# Patient Record
Sex: Male | Born: 1980 | Race: Black or African American | Hispanic: No | Smoking: Current every day smoker
Health system: Southern US, Community
[De-identification: ages and names within clinical notes are randomized; demographics above are authoritative.]

## PROBLEM LIST (undated history)

## (undated) DIAGNOSIS — F1910 Other psychoactive substance abuse, uncomplicated: Secondary | ICD-10-CM

## (undated) DIAGNOSIS — F1010 Alcohol abuse, uncomplicated: Secondary | ICD-10-CM

## (undated) HISTORY — PX: HERNIA REPAIR: SHX51

---

## 2007-12-28 ENCOUNTER — Emergency Department (HOSPITAL_COMMUNITY): Admission: EM | Admit: 2007-12-28 | Discharge: 2007-12-29 | Payer: Self-pay | Admitting: Emergency Medicine

## 2009-04-07 ENCOUNTER — Emergency Department (HOSPITAL_COMMUNITY): Admission: EM | Admit: 2009-04-07 | Discharge: 2009-04-07 | Payer: Self-pay | Admitting: Emergency Medicine

## 2009-11-01 ENCOUNTER — Emergency Department: Payer: Self-pay | Admitting: Emergency Medicine

## 2009-11-16 ENCOUNTER — Ambulatory Visit: Payer: Self-pay | Admitting: Surgery

## 2010-05-15 ENCOUNTER — Emergency Department (HOSPITAL_COMMUNITY)
Admission: EM | Admit: 2010-05-15 | Discharge: 2010-05-15 | Disposition: A | Discharge status: Home / Self Care | Payer: Self-pay | Attending: Emergency Medicine | Admitting: Emergency Medicine

## 2010-05-15 DIAGNOSIS — K029 Dental caries, unspecified: Secondary | ICD-10-CM | POA: Insufficient documentation

## 2010-05-15 DIAGNOSIS — K089 Disorder of teeth and supporting structures, unspecified: Secondary | ICD-10-CM | POA: Insufficient documentation

## 2010-05-18 ENCOUNTER — Emergency Department (HOSPITAL_COMMUNITY): Payer: Self-pay

## 2010-05-18 ENCOUNTER — Emergency Department (HOSPITAL_COMMUNITY)
Admission: EM | Admit: 2010-05-18 | Discharge: 2010-05-18 | Disposition: A | Discharge status: Home / Self Care | Payer: Self-pay | Attending: Emergency Medicine | Admitting: Emergency Medicine

## 2010-05-18 DIAGNOSIS — R221 Localized swelling, mass and lump, neck: Secondary | ICD-10-CM | POA: Insufficient documentation

## 2010-05-18 DIAGNOSIS — K047 Periapical abscess without sinus: Secondary | ICD-10-CM | POA: Insufficient documentation

## 2010-05-18 DIAGNOSIS — F17200 Nicotine dependence, unspecified, uncomplicated: Secondary | ICD-10-CM | POA: Insufficient documentation

## 2010-05-18 DIAGNOSIS — R220 Localized swelling, mass and lump, head: Secondary | ICD-10-CM | POA: Insufficient documentation

## 2010-05-18 LAB — DIFFERENTIAL
Basophils Relative: 0 % (ref 0–1)
Eosinophils Absolute: 0 10*3/uL (ref 0.0–0.7)
Eosinophils Relative: 0 % (ref 0–5)
Lymphocytes Relative: 17 % (ref 12–46)
Monocytes Absolute: 1.5 10*3/uL — ABNORMAL HIGH (ref 0.1–1.0)
Neutrophils Relative %: 69 % (ref 43–77)

## 2010-05-18 LAB — CBC
HCT: 46.9 % (ref 39.0–52.0)
Platelets: 346 10*3/uL (ref 150–400)
RBC: 5.18 MIL/uL (ref 4.22–5.81)
RDW: 12.7 % (ref 11.5–15.5)
WBC: 10.9 10*3/uL — ABNORMAL HIGH (ref 4.0–10.5)

## 2010-05-18 LAB — POCT I-STAT, CHEM 8
BUN: 10 mg/dL (ref 6–23)
Calcium, Ion: 1.07 mmol/L — ABNORMAL LOW (ref 1.12–1.32)
Chloride: 98 mEq/L (ref 96–112)
HCT: 50 % (ref 39.0–52.0)
Potassium: 4 mEq/L (ref 3.5–5.1)

## 2013-09-15 ENCOUNTER — Encounter (HOSPITAL_COMMUNITY): Payer: Self-pay | Admitting: Emergency Medicine

## 2013-09-15 ENCOUNTER — Emergency Department (HOSPITAL_COMMUNITY)
Admission: EM | Admit: 2013-09-15 | Discharge: 2013-09-15 | Disposition: A | Discharge status: Home / Self Care | Payer: Medicaid Other | Attending: Emergency Medicine | Admitting: Emergency Medicine

## 2013-09-15 ENCOUNTER — Emergency Department (HOSPITAL_COMMUNITY): Payer: Medicaid Other

## 2013-09-15 DIAGNOSIS — R509 Fever, unspecified: Secondary | ICD-10-CM | POA: Insufficient documentation

## 2013-09-15 DIAGNOSIS — R109 Unspecified abdominal pain: Secondary | ICD-10-CM | POA: Diagnosis not present

## 2013-09-15 DIAGNOSIS — R0602 Shortness of breath: Secondary | ICD-10-CM | POA: Diagnosis not present

## 2013-09-15 DIAGNOSIS — R079 Chest pain, unspecified: Secondary | ICD-10-CM | POA: Diagnosis not present

## 2013-09-15 DIAGNOSIS — F17200 Nicotine dependence, unspecified, uncomplicated: Secondary | ICD-10-CM | POA: Insufficient documentation

## 2013-09-15 DIAGNOSIS — N289 Disorder of kidney and ureter, unspecified: Secondary | ICD-10-CM | POA: Diagnosis not present

## 2013-09-15 DIAGNOSIS — R51 Headache: Secondary | ICD-10-CM | POA: Diagnosis not present

## 2013-09-15 DIAGNOSIS — R5383 Other fatigue: Secondary | ICD-10-CM | POA: Diagnosis present

## 2013-09-15 DIAGNOSIS — R5381 Other malaise: Secondary | ICD-10-CM | POA: Diagnosis present

## 2013-09-15 LAB — CBC WITH DIFFERENTIAL/PLATELET
Basophils Absolute: 0.2 10*3/uL — ABNORMAL HIGH (ref 0.0–0.1)
Basophils Relative: 3 % — ABNORMAL HIGH (ref 0–1)
Eosinophils Absolute: 0.3 10*3/uL (ref 0.0–0.7)
Eosinophils Relative: 5 % (ref 0–5)
HCT: 49.4 % (ref 39.0–52.0)
Hemoglobin: 16.6 g/dL (ref 13.0–17.0)
Lymphocytes Relative: 42 % (ref 12–46)
Lymphs Abs: 2.3 10*3/uL (ref 0.7–4.0)
MCH: 29.9 pg (ref 26.0–34.0)
MCHC: 33.6 g/dL (ref 30.0–36.0)
MCV: 89 fL (ref 78.0–100.0)
Monocytes Absolute: 0.7 10*3/uL (ref 0.1–1.0)
Monocytes Relative: 12 % (ref 3–12)
Neutro Abs: 2.1 10*3/uL (ref 1.7–7.7)
Neutrophils Relative %: 38 % — ABNORMAL LOW (ref 43–77)
Platelets: 249 10*3/uL (ref 150–400)
RBC: 5.55 MIL/uL (ref 4.22–5.81)
RDW: 13.4 % (ref 11.5–15.5)
WBC: 5.6 10*3/uL (ref 4.0–10.5)

## 2013-09-15 LAB — BASIC METABOLIC PANEL
Anion gap: 14 (ref 5–15)
BUN: 7 mg/dL (ref 6–23)
CO2: 24 mEq/L (ref 19–32)
Calcium: 9.2 mg/dL (ref 8.4–10.5)
Chloride: 96 mEq/L (ref 96–112)
Creatinine, Ser: 1.45 mg/dL — ABNORMAL HIGH (ref 0.50–1.35)
GFR calc Af Amer: 72 mL/min — ABNORMAL LOW (ref >90)
GFR calc non Af Amer: 62 mL/min — ABNORMAL LOW (ref >90)
Glucose, Bld: 89 mg/dL (ref 70–99)
Potassium: 4.5 mEq/L (ref 3.7–5.3)
Sodium: 134 mEq/L — ABNORMAL LOW (ref 137–147)

## 2013-09-15 LAB — URINALYSIS, ROUTINE W REFLEX MICROSCOPIC
Bilirubin Urine: NEGATIVE
Glucose, UA: NEGATIVE mg/dL
Hgb urine dipstick: NEGATIVE
Ketones, ur: NEGATIVE mg/dL
Leukocytes, UA: NEGATIVE
Nitrite: NEGATIVE
Protein, ur: 30 mg/dL — AB
Specific Gravity, Urine: 1.028 (ref 1.005–1.030)
Urobilinogen, UA: 1 mg/dL (ref 0.0–1.0)
pH: 6.5 (ref 5.0–8.0)

## 2013-09-15 LAB — URINE MICROSCOPIC-ADD ON

## 2013-09-15 LAB — I-STAT CG4 LACTIC ACID, ED: Lactic Acid, Venous: 1.1 mmol/L (ref 0.5–2.2)

## 2013-09-15 MED ORDER — IBUPROFEN 200 MG PO TABS
600.0000 mg | ORAL_TABLET | Freq: Once | ORAL | Status: AC
Start: 2013-09-15 — End: 2013-09-15
  Administered 2013-09-15: 600 mg via ORAL
  Filled 2013-09-15: qty 3

## 2013-09-15 MED ORDER — SODIUM CHLORIDE 0.9 % IV BOLUS (SEPSIS)
1000.0000 mL | Freq: Once | INTRAVENOUS | Status: AC
  Administered 2013-09-15: 1000 mL via INTRAVENOUS

## 2013-09-15 NOTE — ED Notes (Signed)
Pt in radiology 

## 2013-09-15 NOTE — ED Notes (Addendum)
Pt c/o intermittent central chest/upper abdominal cramping, SOB, fever, and upper back pain x 1 week.  Pain score 6/10.  Sts pain and SOB increases w/ "carrying things and walking up steps."  Pt reports not feeling well since he went out for a friends birthday x 1 week ago.  Sts symptoms resolve w/ aspirin.

## 2013-09-15 NOTE — Discharge Instructions (Signed)
Fever, Adult A fever is a higher than normal body temperature. In an adult, an oral temperature around 98.6 F (37 C) is considered normal. A temperature of 100.4 F (38 C) or higher is generally considered a fever. Mild or moderate fevers generally have no long-term effects and often do not require treatment. Extreme fever (greater than or equal to 106 F or 41.1 C) can cause seizures. The sweating that may occur with repeated or prolonged fever may cause dehydration. Elderly people can develop confusion during a fever. A measured temperature can vary with:  Age.  Time of day.  Method of measurement (mouth, underarm, rectal, or ear). The fever is confirmed by taking a temperature with a thermometer. Temperatures can be taken different ways. Some methods are accurate and some are not.  An oral temperature is used most commonly. Electronic thermometers are fast and accurate.  An ear temperature will only be accurate if the thermometer is positioned as recommended by the manufacturer.  A rectal temperature is accurate and done for those adults who have a condition where an oral temperature cannot be taken.  An underarm (axillary) temperature is not accurate and not recommended. Fever is a symptom, not a disease.  CAUSES   Infections commonly cause fever.  Some noninfectious causes for fever include:  Some arthritis conditions.  Some thyroid or adrenal gland conditions.  Some immune system conditions.  Some types of cancer.  A medicine reaction.  High doses of certain street drugs such as methamphetamine.  Dehydration.  Exposure to high outside or room temperatures.  Occasionally, the source of a fever cannot be determined. This is sometimes called a "fever of unknown origin" (FUO).  Some situations may lead to a temporary rise in body temperature that may go away on its own. Examples are:  Childbirth.  Surgery.  Intense exercise. HOME CARE INSTRUCTIONS   Take  appropriate medicines for fever. Follow dosing instructions carefully. If you use acetaminophen to reduce the fever, be careful to avoid taking other medicines that also contain acetaminophen. Do not take aspirin for a fever if you are younger than age 65. There is an association with Reye's syndrome. Reye's syndrome is a rare but potentially deadly disease.  If an infection is present and antibiotics have been prescribed, take them as directed. Finish them even if you start to feel better.  Rest as needed.  Maintain an adequate fluid intake. To prevent dehydration during an illness with prolonged or recurrent fever, you may need to drink extra fluid.Drink enough fluids to keep your urine clear or pale yellow.  Sponging or bathing with room temperature water may help reduce body temperature. Do not use ice water or alcohol sponge baths.  Dress comfortably, but do not over-bundle. SEEK MEDICAL CARE IF:   You are unable to keep fluids down.  You develop vomiting or diarrhea.  You are not feeling at least partly better after 3 days.  You develop new symptoms or problems. SEEK IMMEDIATE MEDICAL CARE IF:   You have shortness of breath or trouble breathing.  You develop excessive weakness.  You are dizzy or you faint.  You are extremely thirsty or you are making little or no urine.  You develop new pain that was not there before (such as in the head, neck, chest, back, or abdomen).  You have persistant vomiting and diarrhea for more than 1 to 2 days.  You develop a stiff neck or your eyes become sensitive to light.  You develop a  skin rash.  You have a fever or persistent symptoms for more than 2 to 3 days.  You have a fever and your symptoms suddenly get worse. MAKE SURE YOU:   Understand these instructions.  Will watch your condition.  Will get help right away if you are not doing well or get worse. Document Released: 08/13/2000 Document Revised: 05/12/2011 Document  Reviewed: 12/19/2010 [facility] Patient Information 2015 [place], [place]. This information is not intended to replace advice given to you by your health care provider. Make sure you discuss any questions you have with your health care provider.   Emergency Department Resource Guide 1) Find a Doctor and Pay Out of Pocket Although you won't have to find out who is covered by your insurance plan, it is a good idea to ask around and get recommendations. You will then need to call the office and see if the doctor you have chosen will accept you as a new patient and what types of options they offer for patients who are self-pay. Some doctors offer discounts or will set up payment plans for their patients who do not have insurance, but you will need to ask so you aren't surprised when you get to your appointment.  2) Contact Your Local Health Department Not all health departments have doctors that can see patients for sick visits, but many do, so it is worth a call to see if yours does. If you don't know where your local health department is, you can check in your phone book. The CDC also has a tool to help you locate your state's health department, and many state websites also have listings of all of their local health departments.  3) Find a Walk-in Clinic If your illness is not likely to be very severe or complicated, you may want to try a walk in clinic. These are popping up all over the country in pharmacies, drugstores, and shopping centers. They're usually staffed by nurse practitioners or physician assistants that have been trained to treat common illnesses and complaints. They're usually fairly quick and inexpensive. However, if you have serious medical issues or chronic medical problems, these are probably not your best option.  No Primary Care Doctor: - Call Health Connect at  [phone] - they can help you locate a primary care doctor that  accepts your insurance, provides certain services,  etc. - Physician Referral Service- [phone]  Chronic Pain Problems: Organization         Address  Phone   Notes  [facility]  [phone] Patients need to be referred by their primary care doctor.   Medication Assistance: Organization         Address  Phone   Notes  [facility] Medication [facility] [address] [place] [place], [place] [address] [phone] --Must be a resident of [facility] -- Must have NO insurance coverage whatsoever (no Medicaid/ Medicare, etc.) -- The pt. MUST have a primary care doctor that directs their care regularly and follows them in the community   [facility]ist  [phone]   [employer]  [phone]    Agencies that provide inexpensive medical care: Organization         Address  Phone   Notes  [facility]  [phone]   [facility]    [phone]   [facility] [address] [place], [place] [address] [phone]   [facility]  [address], [place] [phone]) [phone]   Planned Parenthood    [phone]   [facility]    [phone]   [facility]  [address], [place] Phone:  [phone], Fax:  [phone] Hours of Operation:  9 am - 6 pm, M-F.  Also accepts Medicaid/Medicare and self-pay.  [facility]  [address], [place] Phone: [phone], Fax: [phone]) [phone]. Hours of Operation:  8:30 am - 5:30 pm, M-F.  Also accepts Medicaid and self-pay.  [facility] [place] [address], [place] Phone: [phone]) [phone]   [facility] [address], [place], [place] [phone], Ext. [phone] Mondays & Thursdays: 7-9 AM.  First 15 patients are seen on a first come, first serve basis.    Medicaid-accepting [facility] Providers:  Organization         Address  Phone   Notes  [facility] [address], [place] [phone] Also accepts self-pay patients.  [facility] [address], [place]  [phone]   [facility] [address], [place] [phone]   [facility] [address] [place] [address], [place] [phone]) [phone]   Renaye RakersVeita Bland [address], [place]   [phone] Only accepts [place] Access [place] patients after they have their name applied to their card.   Self-Pay (no insurance) in [facility] [place]:  Organization         Address  Phone   Notes  Sickle Cell Patients, [facility] [address], [place] [phone]   [facility] [address], [place] [phone]) [phone]   [facility] [place]  [address], [place] [phone]   [facility]/Dr. Osei-Bonsu  [address] [place] [address], [place] or [phone], [place] [phone] Phone number for both [place] and [place] locations is the same.  [facility] [address], [place] [phone]   [facility] [address] [place] [address], [place] or [address] [phone]   [facility] [address] [place] [address], [place] [phone]) [phone], phone; [phone], fax Sees patients 1st and 3rd Saturday of every month.  Must not qualify for public or private insurance (i.e. Medicaid, Medicare, [place] Health Choice, Veterans' Benefits)  Household income should be no more than 200% of the poverty level The clinic cannot treat you if you are pregnant or think you are pregnant  Sexually transmitted diseases are not treated at the clinic.    Dental Care: Organization         Address  Phone  Notes  [facility] [address], [place] [phone] Accepts children up to  age 33 who are enrolled in [place] or [place] Health Choice; pregnant women with a Medicaid card; and children who have applied for Medicaid or [place] Health Choice, but were declined, whose parents can pay a reduced fee at time of service.  [facility] of [facility] [place]  [address], [place] [phone] Accepts children up to age 33 who are enrolled in [place] or  Health Choice; pregnant women with a Medicaid card; and children who have applied for  Medicaid or [place] Health Choice, but were declined, whose parents can pay a reduced fee at time of service.  [facility]  [address] [place], [place] [phone] Patients are seen by appointment only. Walk-ins are not accepted. [facility] will see patients 8 years of age and older. Monday - Tuesday (8am-5pm) Most Wednesdays (8:30-5pm) $30 per visit, cash only  [facility]  [address], [facility] [phone] Patients are seen by appointment only. Walk-ins are not accepted. [facility] will see patients 69 years of age and older. One Wednesday Evening (Monthly: Volunteer Based).  $30 per visit, cash only  [employer]  [phone] for adults; Children under age 66, call Graduate Pediatric Dentistry at [phone]. Children aged 36-14, please call [phone] to request a pediatric application.  Dental services are provided in all areas of dental care including fillings, crowns and bridges, complete and partial dentures, implants, gum treatment, root canals, and extractions. Preventive care is also provided. Treatment is provided to both adults and children. Patients are selected via a lottery and there is often a waiting list.   [facility] [address] [place]  [phone] [email or web address]   [facility] [address] [place], [place] [phone], Ext. [phone] Second and Fourth Thursday of  each month, opens at 6:30 AM; Clinic ends at 9 AM.  Patients are seen on a first-come first-served basis, and a limited number are seen during each clinic.   [facility]  [address] [place], [place] [phone]   Eligibility Requirements You must have lived in [place], [place], or [place] counties for at least the last three months.   You cannot be eligible for state or federal sponsored [place], including [employer], [place], or [employer].   You generally cannot be eligible for healthcare insurance through your employer.    How to apply: Eligibility screenings are held every Tuesday and Wednesday afternoon from 1:00 pm until 4:00 pm. You do not need an appointment for the interview!  [facility] [address], [place], [place] [phone]   [facility] Health Department  [phone]   [facility] Health Department  [phone]   [facility] Health Department  [phone]    Behavioral Health Resources in the Community: Intensive Outpatient Programs Organization         Address  Phone  Notes  [facility] [address], [place], [place] [phone]   [facility] [address] [place], [place] [phone]   [facility] [address] [place], [place]  [phone]   [facility] [address],  [place], [place] [phone] or [phone]   Substance Abuse Resources Organization         Address  Phone  Notes  [facility]  [phone]   [facility]  [phone]   [facility]  [phone]   Floydene Flock  [phone]   Residential & Outpatient Substance Abuse Program  [phone]   Psychological Services Organization         Address  Phone  Notes  [facility]  [phone]   [facility] Services  [phone]   [facility] [address],  [place] [phone] or [phone]    Mobile Crisis Teams Organization         Address  Phone  Notes  [facility], Mobile Crisis Care Unit  [phone]   [facility]  [address] [place], [place] [phone]   [facility] [address] [place] [phone]    Self-Help/Support Groups Organization         Address  Phone             Notes  [facility] [place] - variety of support groups  [phone]- I7437963 Call for more information  Narcotics Anonymous (NA), [facility] [address], [employer] [place]  2 meetings at this location   Statistician         Address  Phone  Notes  [facility] [address],    [place]  [phone]   [facility]  [address], [place] [address], [place], [place] [phone]   [facility] [address] [place], [place] [phone] Admissions: 8am-3pm M-F  [facility] [address]    [place], [place] [phone]   [facility] [address] [place], [place], [place] [phone]   [facility] [address],  [place], [place] [phone]   [facility] - Intensive Outpatient [address], [place], [place] [phone]   [facility] ([facility] [address] [place]  [place], [place] [phone] or [phone]   [facility] ([facility]) [address] [place], [place] [phone] Accepts Medicaid  [facility] [place] [address]  [place] [phone] Substance Abuse/Addiction Treatment   [facility] Organization         Address  Phone  Notes  [facility]  [phone]   Angie Fava, PhD [address] [place], [place]   [phone] or [phone]   [facility]al   [address] [place], [place] [phone]     [facility] [address] [place], [place] [phone] Insurance/Medicaid/sponsorship through [facility] and Families [address]                                    [place], [place] [phone] Therapy/tele-psych/case  [facility] [address], [place] [phone]    Dr. Lolly Mustache  [phone]   [facility]  [employer] [facility] Dept. 1) [address] [place] 2) [address], [place] 3)  [address], [place] [phone]  [phone]   [facility] Child Abuse Hotline 517 209 7584 or [phone] (After Hours)

## 2013-09-15 NOTE — Progress Notes (Signed)
[facility] CL provided pt with a list of primary care resources and a [facility] [place] Card application to help patient establish primary care.  °

## 2013-09-17 ENCOUNTER — Emergency Department: Payer: Self-pay | Admitting: Emergency Medicine

## 2013-09-17 LAB — BASIC METABOLIC PANEL
Anion Gap: 7 (ref 7–16)
BUN: 10 mg/dL (ref 7–18)
CHLORIDE: 107 mmol/L (ref 98–107)
CO2: 24 mmol/L (ref 21–32)
CREATININE: 1.45 mg/dL — AB (ref 0.60–1.30)
Calcium, Total: 7.3 mg/dL — ABNORMAL LOW (ref 8.5–10.1)
EGFR (Non-African Amer.): >60
GLUCOSE: 108 mg/dL — AB (ref 65–99)
Osmolality: 275 (ref 275–301)
POTASSIUM: 3.6 mmol/L (ref 3.5–5.1)
SODIUM: 138 mmol/L (ref 136–145)

## 2013-09-17 LAB — CBC WITH DIFFERENTIAL/PLATELET
Comment - H1-Com3: NORMAL
Eosinophil: 5 %
HCT: 47.3 % (ref 40.0–52.0)
HGB: 15.7 g/dL (ref 13.0–18.0)
Lymphocytes: 27 %
MCH: 30.2 pg (ref 26.0–34.0)
MCHC: 33.1 g/dL (ref 32.0–36.0)
MCV: 91 fL (ref 80–100)
Monocytes: 1 %
Platelet: 263 10*3/uL (ref 150–440)
RBC: 5.18 10*6/uL (ref 4.40–5.90)
RDW: 13.7 % (ref 11.5–14.5)
Segmented Neutrophils: 22 %
Variant Lymphocyte - H1-Rlymph: 45 %
WBC: 7.4 10*3/uL (ref 3.8–10.6)

## 2013-09-17 LAB — COMPREHENSIVE METABOLIC PANEL
ALBUMIN: 3.6 g/dL (ref 3.4–5.0)
ALK PHOS: 98 U/L
AST: 81 U/L — AB (ref 15–37)
Anion Gap: 8 (ref 7–16)
BUN: 11 mg/dL (ref 7–18)
Bilirubin,Total: 0.8 mg/dL (ref 0.2–1.0)
CALCIUM: 8.2 mg/dL — AB (ref 8.5–10.1)
Chloride: 102 mmol/L (ref 98–107)
Co2: 23 mmol/L (ref 21–32)
Creatinine: 1.45 mg/dL — ABNORMAL HIGH (ref 0.60–1.30)
EGFR (Non-African Amer.): >60
GLUCOSE: 90 mg/dL (ref 65–99)
OSMOLALITY: 265 (ref 275–301)
POTASSIUM: 3.7 mmol/L (ref 3.5–5.1)
SGPT (ALT): 106 U/L — ABNORMAL HIGH (ref 12–78)
SODIUM: 133 mmol/L — AB (ref 136–145)
Total Protein: 7 g/dL (ref 6.4–8.2)

## 2013-09-22 LAB — CULTURE, BLOOD (SINGLE)

## 2013-09-22 NOTE — ED Provider Notes (Signed)
CSN: [id number]     Arrival date & time 09/15/13  4098 History   First MD Initiated Contact with Patient 09/15/13 0919     Chief Complaint  Patient presents with  . Weakness  . Chest Pain  . Shortness of Breath     (Consider location/radiation/quality/duration/timing/severity/associated sxs/prior Treatment) HPI  33 year old male with fever, body aches, headache abdominal cramping. Symptom onset about a week ago. He started after going up the night before for a friend's birthday. He initially thought that he might have overdone it. Symptoms have persisted for this long to which is why he presented today. Denies any respiratory complaints. Triage note was reviewed, the patient denies any shortness of breath to me. No cough. Mild diffuse throbbing headache. No neck stiffness. Patient has been taking NSAIDs with improvement. No abdominal pain. No urinary complaints. No joint swelling. No rash. No sick contacts that he is aware of. No significant past medical history.  History reviewed. No pertinent past medical history. Past Surgical History  Procedure Laterality Date  . Hernia repair     History reviewed. No pertinent family history. History  Substance Use Topics  . Smoking status: Current Every Day Smoker -- 0.50 packs/day    Types: Cigarettes  . Smokeless tobacco: Not on file  . Alcohol Use: Yes    Review of Systems  All systems reviewed and negative, other than as noted in HPI.   Allergies  Review of patient's allergies indicates no known allergies.  Home Medications   Prior to Admission medications   Medication Sig Start Date End Date Taking? Authorizing Provider  Aspirin-Caffeine (BC FAST PAIN RELIEF) 845-65 MG PACK Take 1 Package by mouth 3 (three) times daily as needed (pain).   Yes Historical Provider, MD  Pseudoephedrine-APAP-DM (DAYQUIL MULTI-SYMPTOM COLD/FLU PO) Take 1 tablet by mouth daily as needed (cold/flu symptoms).   Yes Historical Provider, MD   BP 114/53   Pulse 62  Temp(Src) 98.8 F (37.1 C) (Oral)  Resp 18  SpO2 100% Physical Exam  Nursing note and vitals reviewed. Constitutional: He appears well-developed and well-nourished. No distress.  HENT:  Head: Normocephalic and atraumatic.  Right Ear: External ear normal.  Left Ear: External ear normal.  Oropharynx is clear  Eyes: Conjunctivae and EOM are normal. Pupils are equal, round, and reactive to light. Right eye exhibits no discharge. Left eye exhibits no discharge.  Neck: Neck supple.  No nuchal rigidity  Cardiovascular: Normal rate, regular rhythm and normal heart sounds.  Exam reveals no gallop and no friction rub.   No murmur heard. Pulmonary/Chest: Effort normal and breath sounds normal. No respiratory distress.  Abdominal: Soft. He exhibits no distension. There is no tenderness.  Musculoskeletal: He exhibits no edema and no tenderness.  Lower extremities symmetric as compared to each other. No calf tenderness. Negative Homan's. No palpable cords.   Neurological: He is alert.  Skin: Skin is warm and dry. No erythema.  Psychiatric: He has a normal mood and affect. His behavior is normal. Thought content normal.    ED Course  Procedures (including critical care time) Labs Review Labs Reviewed  CBC WITH DIFFERENTIAL - Abnormal; Notable for the following:    Neutrophils Relative % 38 (*)    Basophils Relative 3 (*)    Basophils Absolute 0.2 (*)    All other components within normal limits  BASIC METABOLIC PANEL - Abnormal; Notable for the following:    Sodium 134 (*)    Creatinine, Ser 1.45 (*)  GFR calc non Af Amer 62 (*)    GFR calc Af Amer 72 (*)    All other components within normal limits  URINALYSIS, ROUTINE W REFLEX MICROSCOPIC - Abnormal; Notable for the following:    Color, Urine AMBER (*)    APPearance CLOUDY (*)    Protein, ur 30 (*)    All other components within normal limits  URINE MICROSCOPIC-ADD ON  I-STAT CG4 LACTIC ACID, ED    Imaging  Review No results found.  Dg Chest 2 View  09/15/2013   CLINICAL DATA:  Mid chest pain and shortness of breath and fever for several days; history of tobacco use  EXAM: CHEST  2 VIEW  COMPARISON:  None.  FINDINGS: The lungs are mildly hyperinflated and clear. The heart and mediastinal structures are normal. There is no pleural effusion or pneumothorax. The bony thorax is unremarkable.  IMPRESSION: There is mild hyperinflation which may be voluntary or could reflect underlying reactive airway disease. There is no focal pneumonia nor other acute cardiopulmonary abnormality.   Electronically Signed   By: David  SwazilandJordan   On: 09/15/2013 10:25    EKG Interpretation   Date/Time:  Thursday September 15 2013 09:21:56 EDT Ventricular Rate:  81 PR Interval:  147 QRS Duration: 98 QT Interval:  355 QTC Calculation: 412 R Axis:   132 Text Interpretation:  Sinus rhythm Right axis deviation ST elev, probable  normal early repol pattern ED PHYSICIAN INTERPRETATION AVAILABLE IN [facility] Confirmed by TEST, Record ([id number]) on 09/17/2013 12:17:18 PM      MDM   Final diagnoses:  Febrile illness   33 year old male with a febrile illness. Etiology is not completely clear. Associated symptoms are nonspecific. Fatigue, mild headache, body aches, etc. he appears well. Lungs are clear. No respiratory complaints chest x-ray is clear. Urinalysis is unremarkable. Some mild renal insufficiency noted. Possibly some insensible losses secondary to fever. He was given some IV fluids. Patient is up and ambulatory in emergency room. Feels stable for discharge at this time. Antibiotics deferred as this may simply be some type of viral illness. Plan symptomatic treatment with NSAIDs, plenty of rest and staying well hydrated. Patient understands the need to return for evaluation for worsening symptoms, persistent fever or anything else concerning to them.    Raeford RazorStephen Merion Grimaldo, MD 09/22/13 1048

## 2015-08-13 IMAGING — CR DG CHEST 2V
2 series · 2 of 2 positions shown · non-contrast
Comparison: None.

CLINICAL DATA: Mid chest pain and shortness of breath and fever for
several days; history of tobacco use

EXAM:
CHEST  2 VIEW

[w chest pa]
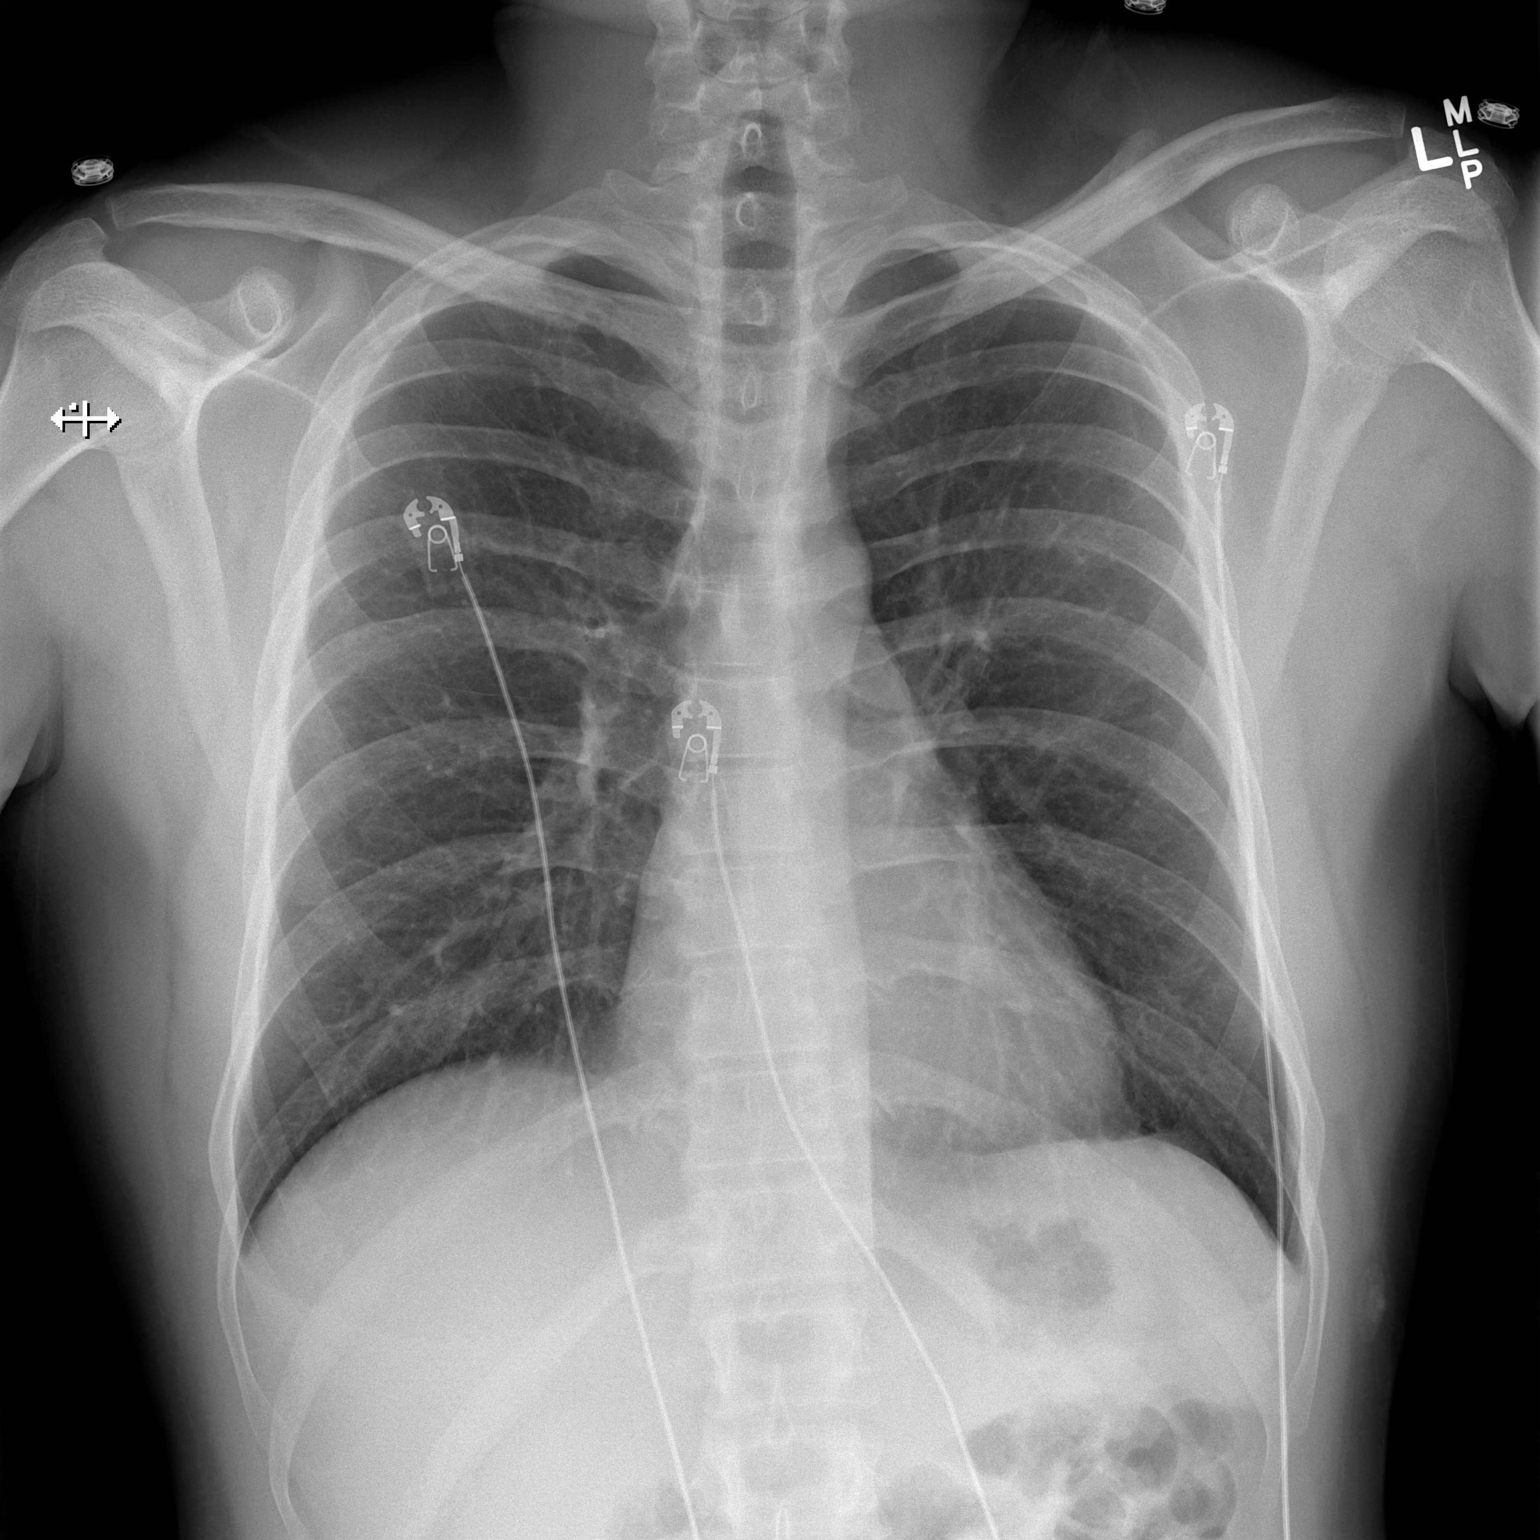

[w chest lat]
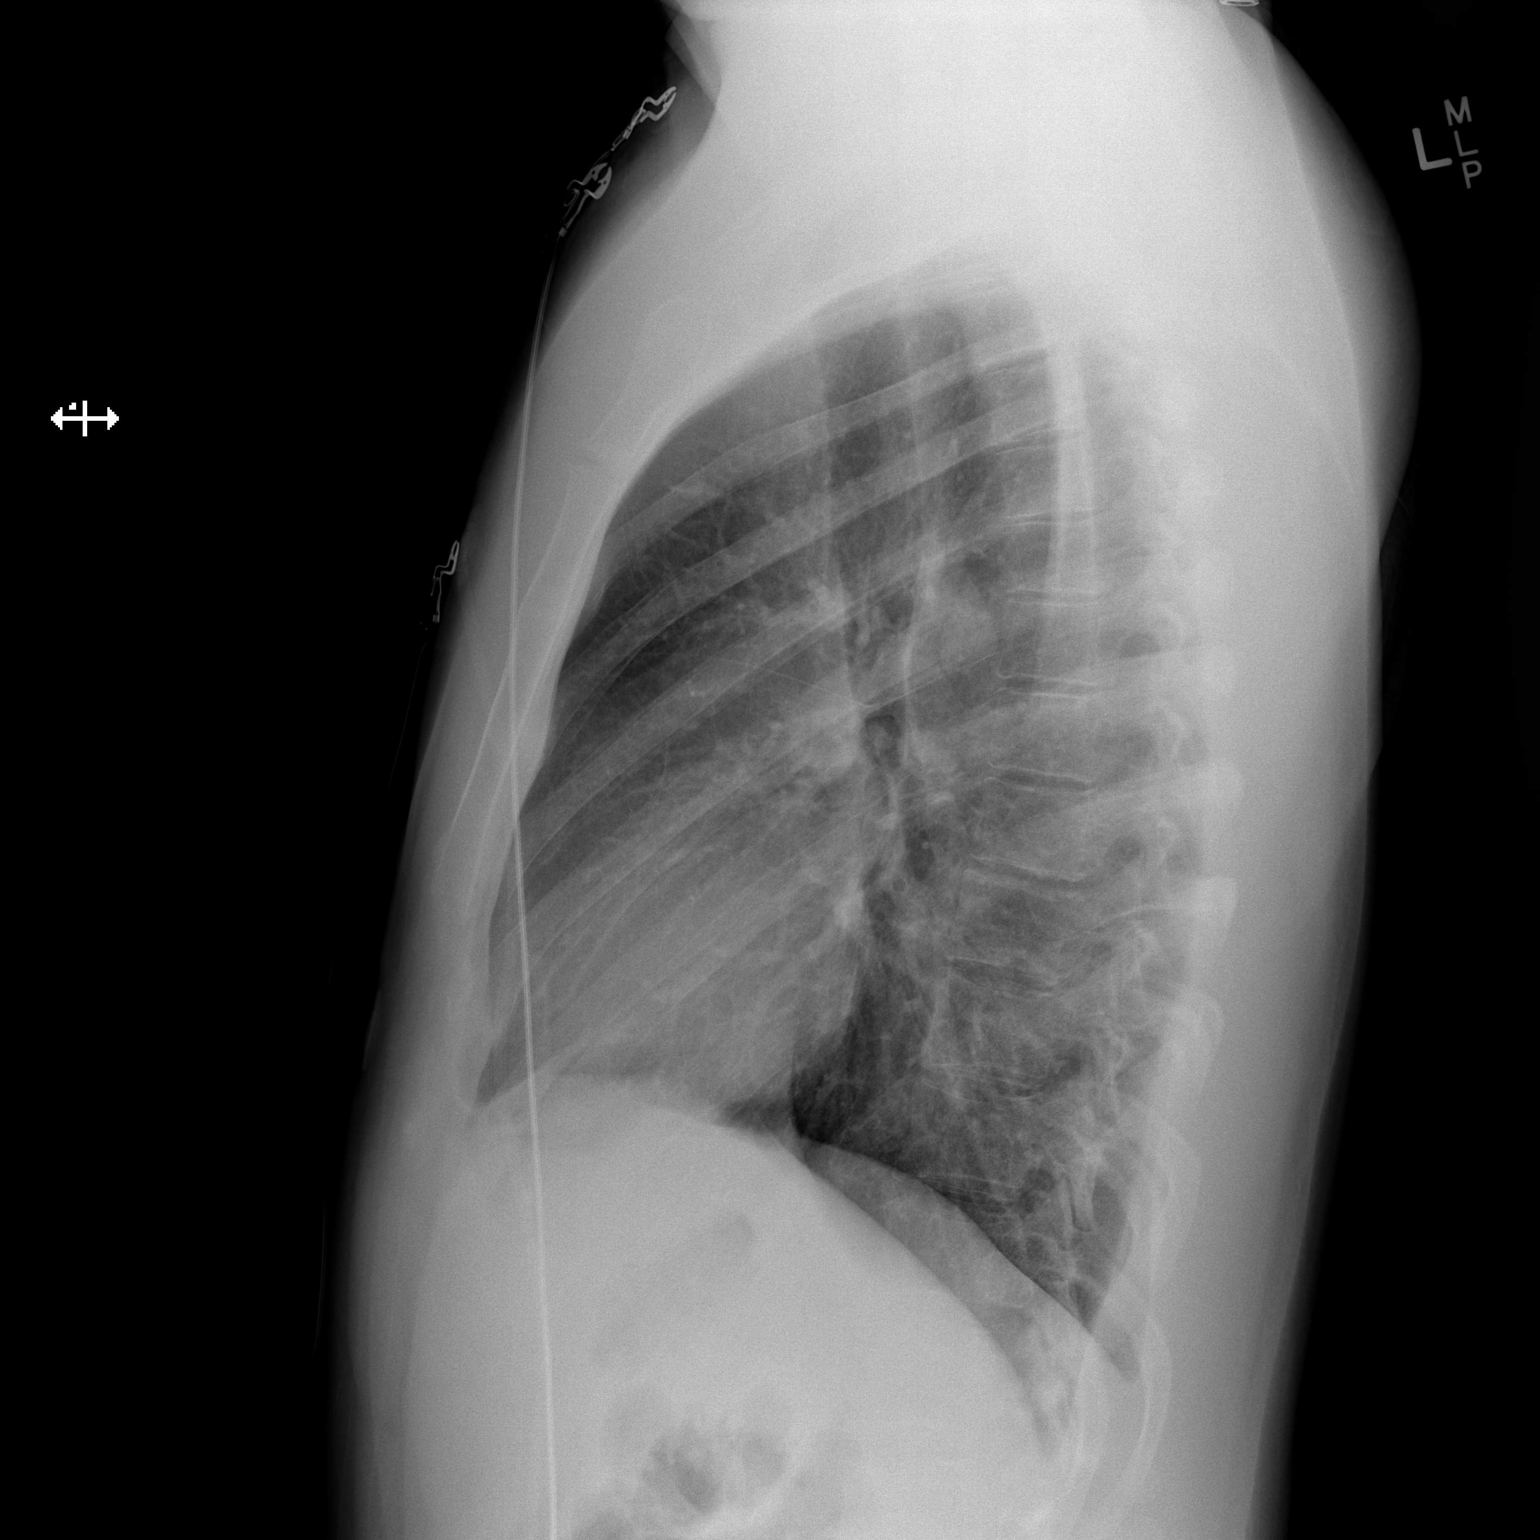

[2 of 2 positions shown; findings below may reference images not displayed]

FINDINGS: The lungs are mildly hyperinflated and clear. The heart and
mediastinal structures are normal. There is no pleural effusion or
pneumothorax. The bony thorax is unremarkable.
IMPRESSION: There is mild hyperinflation which may be voluntary or could reflect
underlying reactive airway disease. There is no focal pneumonia nor
other acute cardiopulmonary abnormality.

## 2015-08-15 IMAGING — CR DG CHEST 2V
1 series · 2 of 2 positions shown · non-contrast
Comparison: None.

CLINICAL DATA: Intermittent cough for 4 days.  Fever.

EXAM:
CHEST  2 VIEW

[Series 1: w chest pa · 0.14mm/px · 2 of 2 slices shown]
[im 1/2]
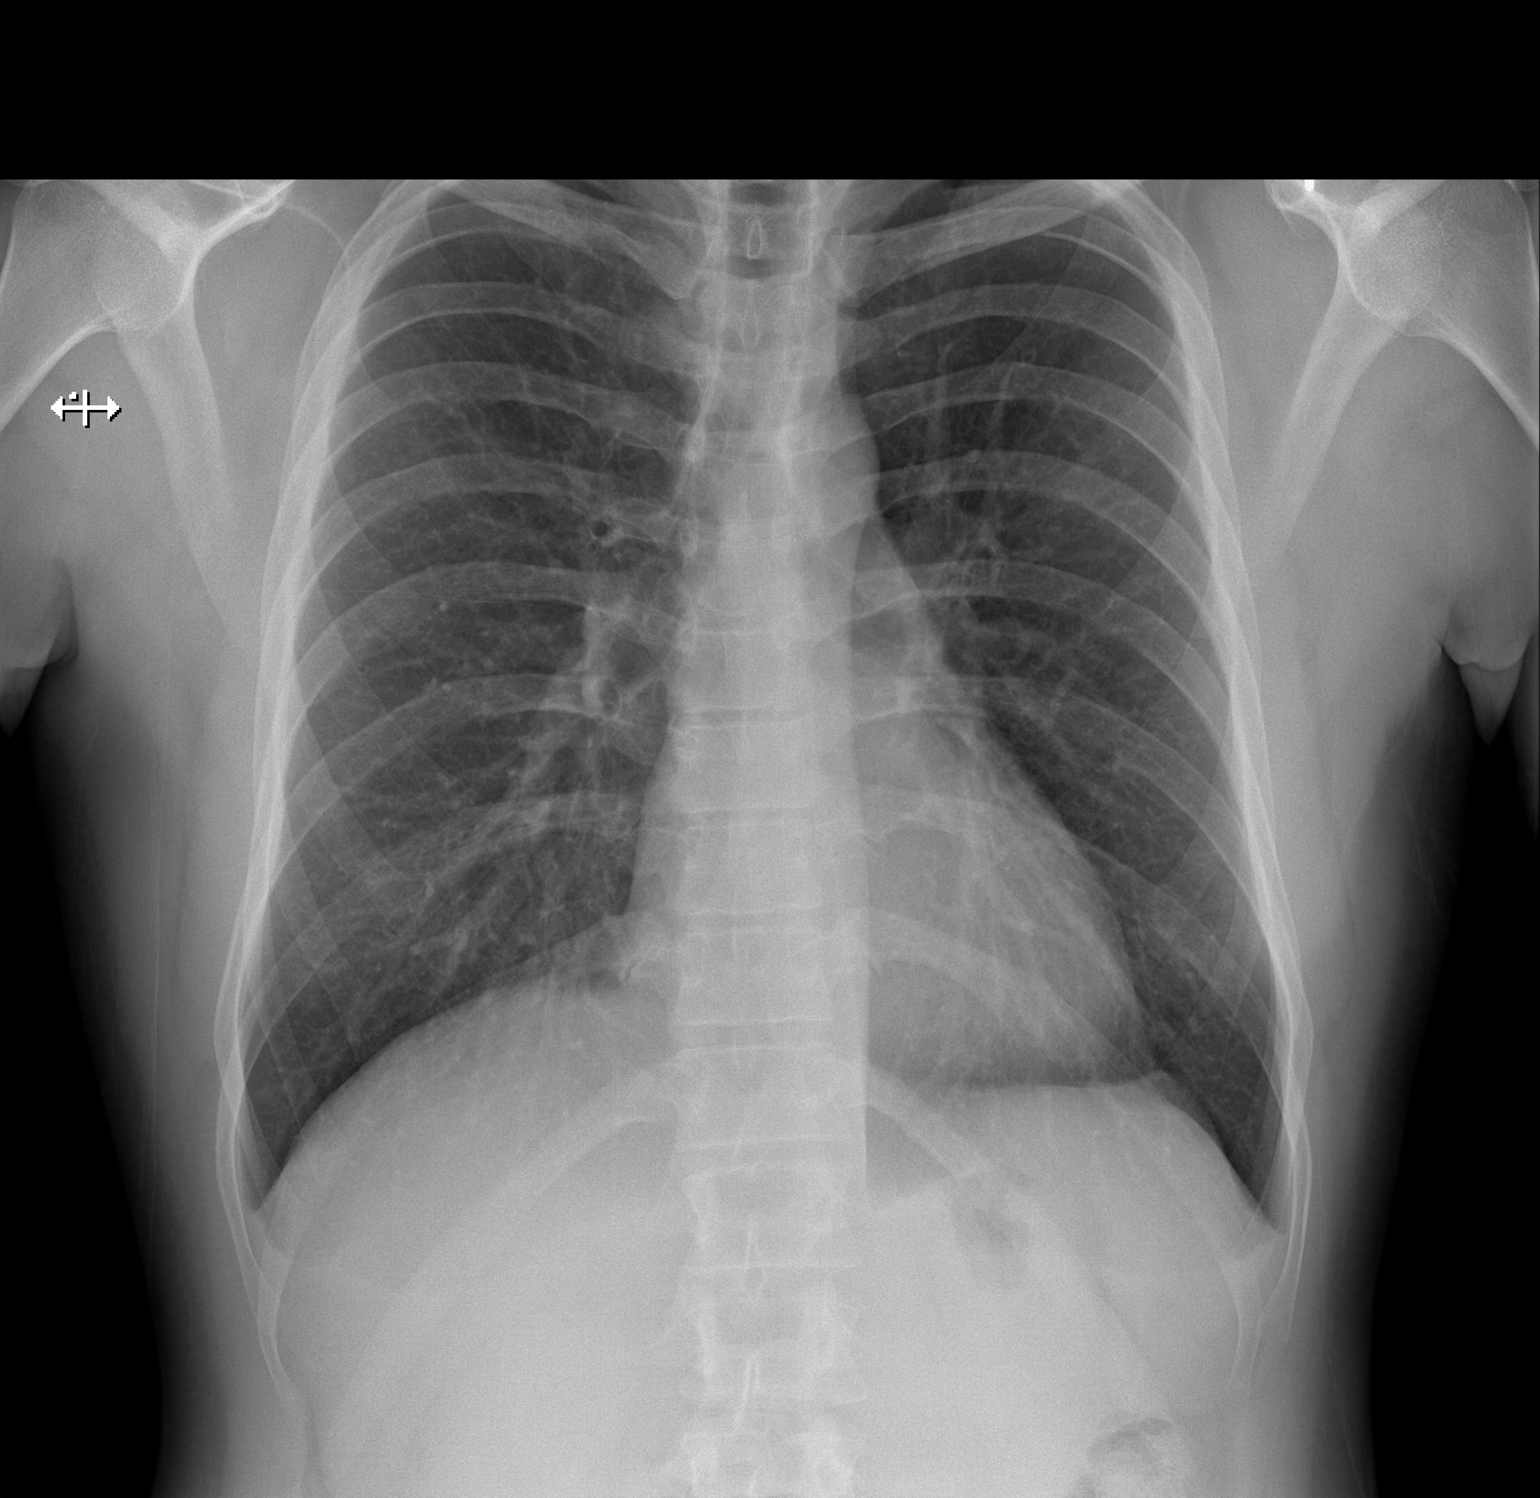
[im 2/2]
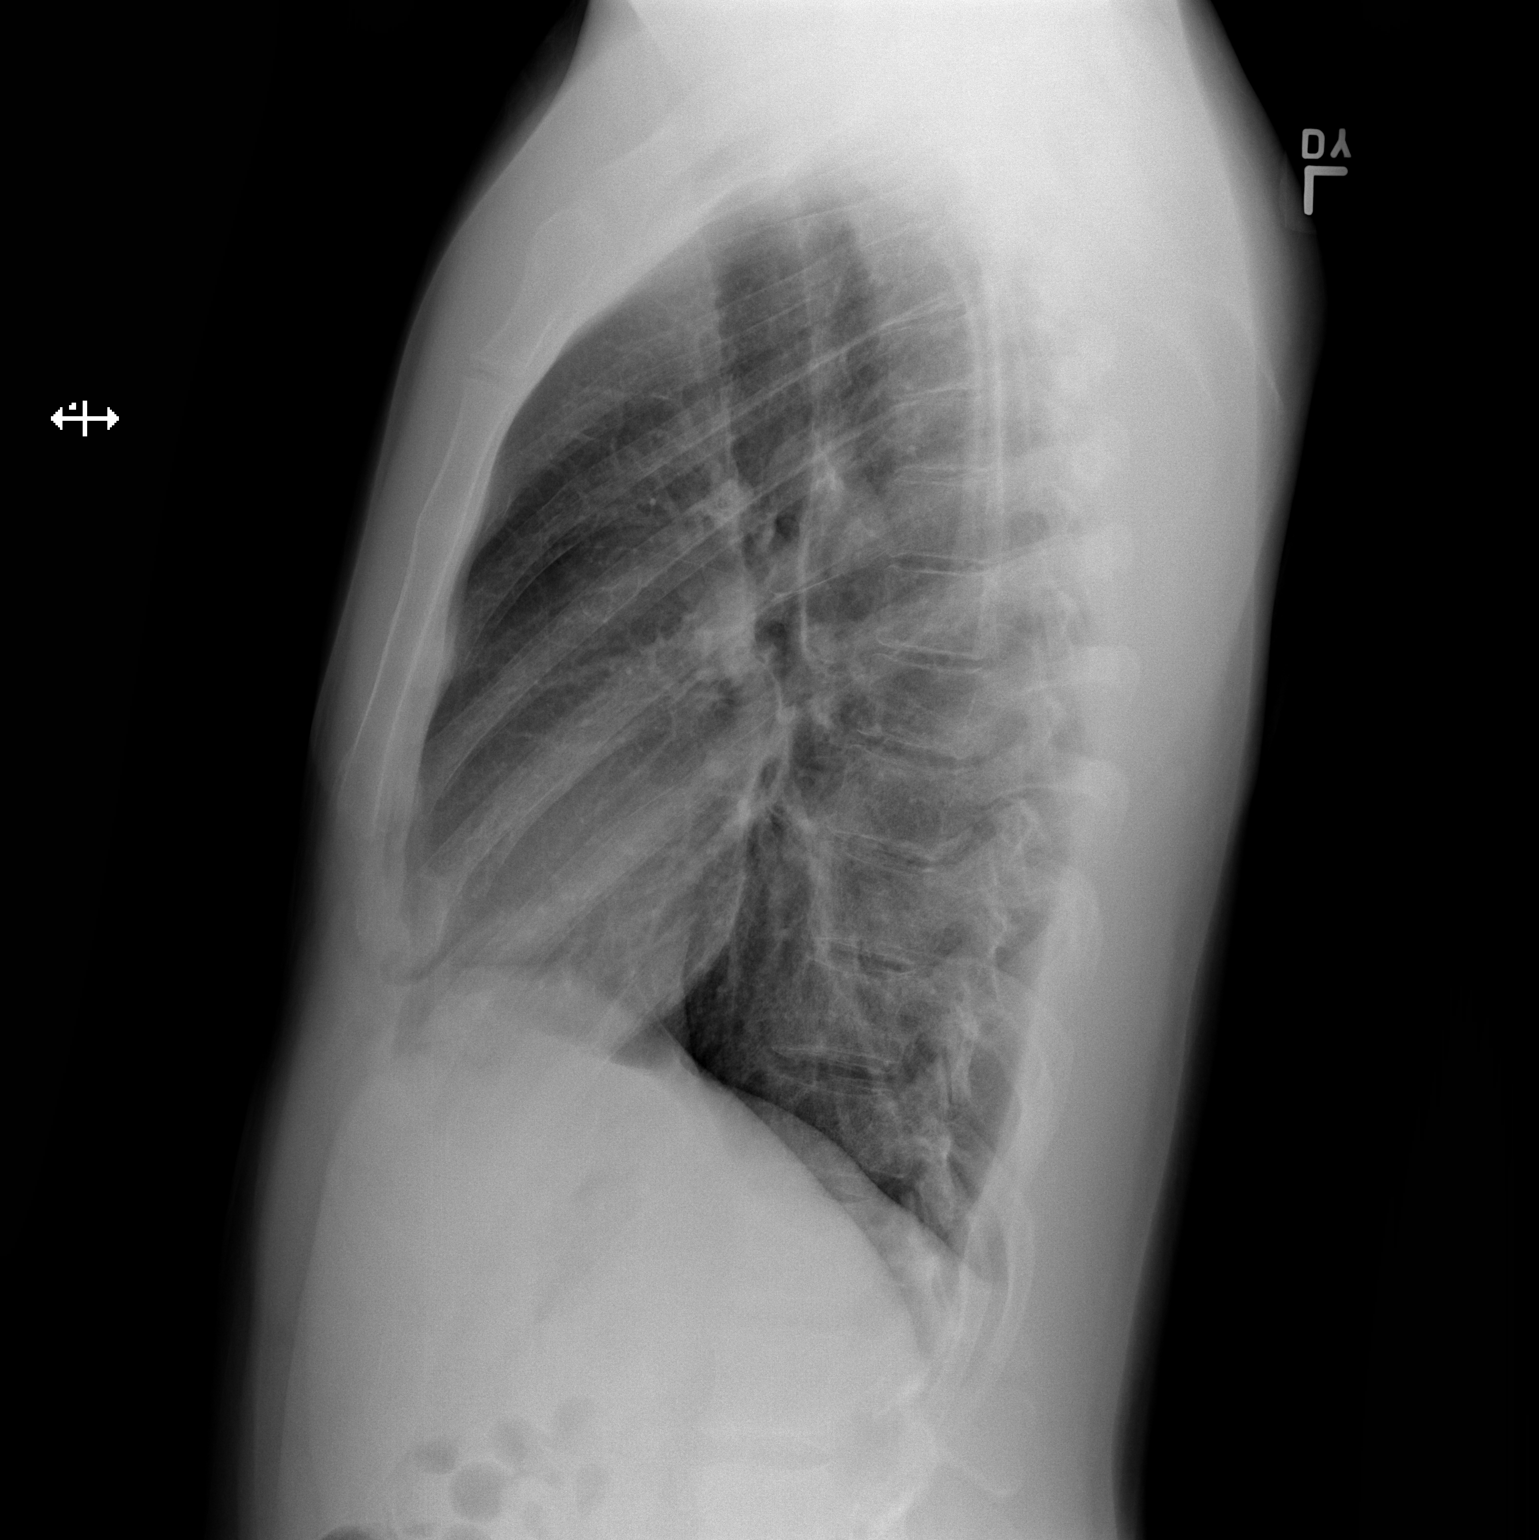

[2 of 2 positions shown; findings below may reference images not displayed]

FINDINGS: Midline trachea. Normal heart size and mediastinal contours. Mild
right hemidiaphragm elevation. No pleural effusion or pneumothorax.
Clear lungs.
IMPRESSION: Normal chest.

## 2015-09-26 ENCOUNTER — Emergency Department
Admission: EM | Admit: 2015-09-26 | Discharge: 2015-09-26 | Disposition: A | Discharge status: Home / Self Care | Payer: Medicaid Other | Attending: Emergency Medicine | Admitting: Emergency Medicine

## 2015-09-26 DIAGNOSIS — Z5321 Procedure and treatment not carried out due to patient leaving prior to being seen by health care provider: Secondary | ICD-10-CM | POA: Insufficient documentation

## 2015-09-26 DIAGNOSIS — H9201 Otalgia, right ear: Secondary | ICD-10-CM | POA: Diagnosis not present

## 2015-09-26 NOTE — ED Triage Notes (Signed)
Patient ambulatory to triage with steady gait, without difficulty or distress noted; pt reports left earache on Sunday with sinus congestion; tonight with right earache

## 2015-11-19 ENCOUNTER — Emergency Department
Admission: EM | Admit: 2015-11-19 | Discharge: 2015-11-19 | Disposition: A | Discharge status: Home / Self Care | Payer: Medicaid Other | Attending: Emergency Medicine | Admitting: Emergency Medicine

## 2015-11-19 ENCOUNTER — Encounter: Payer: Self-pay | Admitting: Emergency Medicine

## 2015-11-19 DIAGNOSIS — E639 Nutritional deficiency, unspecified: Secondary | ICD-10-CM | POA: Diagnosis not present

## 2015-11-19 DIAGNOSIS — K648 Other hemorrhoids: Secondary | ICD-10-CM

## 2015-11-19 DIAGNOSIS — F17210 Nicotine dependence, cigarettes, uncomplicated: Secondary | ICD-10-CM | POA: Diagnosis not present

## 2015-11-19 DIAGNOSIS — K625 Hemorrhage of anus and rectum: Secondary | ICD-10-CM | POA: Diagnosis present

## 2015-11-19 DIAGNOSIS — K644 Residual hemorrhoidal skin tags: Secondary | ICD-10-CM

## 2015-11-19 LAB — COMPREHENSIVE METABOLIC PANEL
ALK PHOS: 69 U/L (ref 38–126)
ALT: 39 U/L (ref 17–63)
AST: 36 U/L (ref 15–41)
Albumin: 4.2 g/dL (ref 3.5–5.0)
Anion gap: 7 (ref 5–15)
BILIRUBIN TOTAL: 0.8 mg/dL (ref 0.3–1.2)
BUN: 7 mg/dL (ref 6–20)
CO2: 27 mmol/L (ref 22–32)
CREATININE: 1.21 mg/dL (ref 0.61–1.24)
Calcium: 9.2 mg/dL (ref 8.9–10.3)
Chloride: 104 mmol/L (ref 101–111)
GFR calc Af Amer: >60 mL/min (ref >60)
GLUCOSE: 91 mg/dL (ref 65–99)
Potassium: 4.4 mmol/L (ref 3.5–5.1)
Sodium: 138 mmol/L (ref 135–145)
TOTAL PROTEIN: 7 g/dL (ref 6.5–8.1)

## 2015-11-19 LAB — CBC
HCT: 48.5 % (ref 40.0–52.0)
Hemoglobin: 16.4 g/dL (ref 13.0–18.0)
MCH: 31.6 pg (ref 26.0–34.0)
MCHC: 33.8 g/dL (ref 32.0–36.0)
MCV: 93.3 fL (ref 80.0–100.0)
PLATELETS: 291 10*3/uL (ref 150–440)
RBC: 5.2 MIL/uL (ref 4.40–5.90)
RDW: 14.7 % — AB (ref 11.5–14.5)
WBC: 8.7 10*3/uL (ref 3.8–10.6)

## 2015-11-19 LAB — URINALYSIS COMPLETE WITH MICROSCOPIC (ARMC ONLY)
BILIRUBIN URINE: NEGATIVE
Bacteria, UA: NONE SEEN
GLUCOSE, UA: NEGATIVE mg/dL
HGB URINE DIPSTICK: NEGATIVE
Ketones, ur: NEGATIVE mg/dL
LEUKOCYTES UA: NEGATIVE
NITRITE: NEGATIVE
Protein, ur: NEGATIVE mg/dL
SPECIFIC GRAVITY, URINE: 1.012 (ref 1.005–1.030)
pH: 7 (ref 5.0–8.0)

## 2015-11-19 LAB — LIPASE, BLOOD: Lipase: 30 U/L (ref 11–51)

## 2015-11-19 MED ORDER — HYDROCORTISONE 2.5 % RE CREA: TOPICAL_CREAM | RECTAL | 0 refills | Status: AC

## 2015-11-19 NOTE — ED Notes (Signed)
Chaperoned rectal exam by Sharma CovertNorman, RN. Pt tolerated well.

## 2015-11-19 NOTE — ED Provider Notes (Signed)
[facility] Emergency Department Provider Note  ____________________________________________  Time seen: Approximately 3:39 PM  I have reviewed the triage vital signs and the nursing notes.   HISTORY  Chief Complaint Abdominal Pain and Rectal Bleeding    HPI Phillip Stephenson is a 35 y.o. male with a low fiber diet presenting with 1 month of intermittent blood streaks with defecation. The patient reports that he has a low fiber diet and often strains when he has a bowel movement. He has noted an increasing frequency of hard stool mixed with blood streaks over the past month. Occasionally has some discomfort with defecation. He denies any fever, chills, lightheadedness or shortness of breath.   History reviewed. No pertinent past medical history.  There are no active problems to display for this patient.   Past Surgical History:  Procedure Laterality Date  . HERNIA REPAIR      Current Outpatient Rx  . Order #: [id number] Class: Historical Med  . Order #: [id number] Class: Print  . Order #: [id number] Class: Historical Med    Allergies Review of patient's allergies indicates no known allergies.  History reviewed. No pertinent family history.  Social History Social History  Substance Use Topics  . Smoking status: Current Every Day Smoker    Packs/day: 0.50    Types: Cigarettes  . Smokeless tobacco: Never Used  . Alcohol use Yes    Review of Systems Constitutional: No fever/chills.No lightheadedness or syncope. Cardiovascular: Denies chest pain. Denies palpitations. Respiratory: Denies shortness of breath.  No cough. Gastrointestinal: No abdominal pain.  No nausea, no vomiting.  No diarrhea.  Positive constipation. Genitourinary: Negative for dysuria. Positive for leading with defecation. Musculoskeletal: Negative for back pain. Skin: Negative for rash. Neurological: Negative for headaches. No focal numbness, tingling or weakness.   10-point ROS  otherwise negative.  ____________________________________________   PHYSICAL EXAM:  VITAL SIGNS: ED Triage Vitals  Enc Vitals Group     BP 11/19/15 1324 (!) 134/95     Pulse Rate 11/19/15 1324 62     Resp 11/19/15 1324 18     Temp 11/19/15 1324 97.2 F (36.2 C)     Temp Source 11/19/15 1324 Oral     SpO2 11/19/15 1324 100 %     Weight 11/19/15 1325 180 lb (81.6 kg)     Height 11/19/15 1325 5\' 6"  (1.676 m)     Head Circumference --      Peak Flow --      Pain Score 11/19/15 1325 7     Pain Loc --      Pain Edu? --      Excl. in GC? --     Constitutional: Alert and oriented. Well appearing and in no acute distress. Answers questions appropriately. Eyes: Conjunctivae are normal.  EOMI. No scleral icterus. Head: Atraumatic. Nose: No congestion/rhinnorhea. Mouth/Throat: Mucous membranes are moist.  Neck: No stridor.  Supple.   Cardiovascular: Normal rate, regular rhythm. No murmurs, rubs or gallops.  Respiratory: Normal respiratory effort.  No accessory muscle use or retractions. Lungs CTAB.  No wheezes, rales or ronchi. Gastrointestinal: Soft, nontender and nondistended.  No guarding or rebound.  No peritoneal signs. GU: Small nonthrombosed external hemorrhoid at 6:00. Positive palpable internal hemorrhoids without significant pain or discomfort. Stool is brown and guaiac-negative. Musculoskeletal: No LE edema.  Neurologic:  A&Ox3.  Speech is clear.  Face and smile are symmetric.  EOMI.  Moves all extremities well. Skin:  Skin is warm, dry and intact. No rash noted.  Psychiatric: Mood and affect are normal. Speech and behavior are normal.  Normal judgement  ____________________________________________   LABS (all labs ordered are listed, but only abnormal results are displayed)  Labs Reviewed  CBC - Abnormal; Notable for the following:       Result Value   RDW 14.7 (*)    All other components within normal limits  URINALYSIS COMPLETEWITH MICROSCOPIC ([facility] ONLY) -  Abnormal; Notable for the following:    Color, Urine YELLOW (*)    APPearance CLEAR (*)    Squamous Epithelial / LPF 0-5 (*)    All other components within normal limits  LIPASE, BLOOD  COMPREHENSIVE METABOLIC PANEL   ____________________________________________  EKG  Not indicated ____________________________________________  RADIOLOGY  No results found.  ____________________________________________   PROCEDURES  Procedure(s) performed: None  Procedures  Critical Care performed: No ____________________________________________   INITIAL IMPRESSION / ASSESSMENT AND PLAN / ED COURSE  Pertinent labs & imaging results that were available during my care of the patient were reviewed by me and considered in my medical decision making (see chart for details).  35 y.o. male with a low fiber diet presenting with bright red blood streaks mixed with hard stools for the past month. Overall, the patient is well-appearing. He has stable vital signs, his hemoglobin and hematocrit are normal, and he has no signs or symptoms consistent with anemia. His examination is most consistent with internal and external hemorrhoids, which are likely the cause of his blood streaks. I will start him on a high-fiber diet with a daily stool softener and Anusol, and have him follow-up. He understands that he will need follow-up and that while the likelihood of a malignancy or other cause for his bleeding such as diverticulitis is low, that he will need reevaluation of the primary care physician. Henderson's return precautions as well as follow-up instructions.  ____________________________________________  FINAL CLINICAL IMPRESSION(S) / ED DIAGNOSES  Final diagnoses:  Internal hemorrhoids  External hemorrhoids  Poor diet  Bright red rectal bleeding    Clinical Course      NEW MEDICATIONS STARTED DURING THIS VISIT:  New Prescriptions   HYDROCORTISONE (ANUSOL-HC) 2.5 % RECTAL CREAM    Apply  rectally 2 times daily      Rockne Menghini, MD 11/19/15 1542

## 2015-11-19 NOTE — Discharge Instructions (Signed)
Please follow the instructions for high fiber diet and start a daily over-the-counter stool softener to prevent constipation and straining. This should improve your hemorrhoids. In addition, you may take Anusol.  Return to the emergency department if you develop severe pain, lightheadedness or shortness of breath, fever, or any other symptoms concerning to you.

## 2015-11-19 NOTE — ED Triage Notes (Signed)
Pt to ed with c/o abd pain and bloody stools x 1 month.

## 2017-03-09 ENCOUNTER — Encounter: Payer: Self-pay | Admitting: Emergency Medicine

## 2017-03-09 ENCOUNTER — Emergency Department
Admission: EM | Admit: 2017-03-09 | Discharge: 2017-03-09 | Disposition: A | Discharge status: Home / Self Care | Payer: Self-pay | Attending: Emergency Medicine | Admitting: Emergency Medicine

## 2017-03-09 DIAGNOSIS — K625 Hemorrhage of anus and rectum: Secondary | ICD-10-CM | POA: Insufficient documentation

## 2017-03-09 DIAGNOSIS — F17210 Nicotine dependence, cigarettes, uncomplicated: Secondary | ICD-10-CM | POA: Insufficient documentation

## 2017-03-09 LAB — COMPREHENSIVE METABOLIC PANEL
ALT: 34 U/L (ref 17–63)
ANION GAP: 9 (ref 5–15)
AST: 35 U/L (ref 15–41)
Albumin: 4.2 g/dL (ref 3.5–5.0)
Alkaline Phosphatase: 73 U/L (ref 38–126)
BUN: 14 mg/dL (ref 6–20)
CHLORIDE: 104 mmol/L (ref 101–111)
CO2: 24 mmol/L (ref 22–32)
Calcium: 9.1 mg/dL (ref 8.9–10.3)
Creatinine, Ser: 1.24 mg/dL (ref 0.61–1.24)
Glucose, Bld: 125 mg/dL — ABNORMAL HIGH (ref 65–99)
POTASSIUM: 3.7 mmol/L (ref 3.5–5.1)
Sodium: 137 mmol/L (ref 135–145)
Total Bilirubin: 0.7 mg/dL (ref 0.3–1.2)
Total Protein: 7.2 g/dL (ref 6.5–8.1)

## 2017-03-09 LAB — CBC
HEMATOCRIT: 50 % (ref 40.0–52.0)
Hemoglobin: 16.9 g/dL (ref 13.0–18.0)
MCH: 32 pg (ref 26.0–34.0)
MCHC: 33.8 g/dL (ref 32.0–36.0)
MCV: 94.8 fL (ref 80.0–100.0)
Platelets: 278 10*3/uL (ref 150–440)
RBC: 5.27 MIL/uL (ref 4.40–5.90)
RDW: 14.7 % — ABNORMAL HIGH (ref 11.5–14.5)
WBC: 10.5 10*3/uL (ref 3.8–10.6)

## 2017-03-09 MED ORDER — DOCUSATE SODIUM 100 MG PO CAPS: 100.0000 mg | ORAL_CAPSULE | Freq: Two times a day (BID) | ORAL | 1 refills | Status: AC

## 2017-03-09 NOTE — ED Triage Notes (Signed)
Patient states that he was diagnosed with hemorrhoids about 2 months ago. Patient states that he had a small amount of bleeding with bowl movements since he was told he has hemorrhoids. Patient states that last night and this morning he has had a moderate amount of bright red bleeding with is bowl movement.

## 2017-03-09 NOTE — ED Provider Notes (Signed)
[facility] Emergency Department Provider Note  Time seen: 8:56 AM  I have reviewed the triage vital signs and the nursing notes.   HISTORY  Chief Complaint GI Bleeding    HPI Phillip Stephenson is a 37 y.o. male with no past medical history who presents to the emergency department for rectal bleeding.  According to the patient for the past 2 months he will intermittently have bleeding when he has a bowel movement.  He states it is not every time but occasionally he will note blood either in the toilet or on the toilet paper.  He states last night he had a large bowel movement and there was a considerable amount of blood in the toilet.  States his stool was formed and appeared to be normal in color.  Had another bowel movement this morning and again had blood in the toilet so he came to the emergency department for evaluation.  He states he was having lower abdominal cramping yesterday, but denies any today or currently.  Denies any fever.  Largely negative review of systems.  He does state occasionally he will have a sharp pain in his rectum/anus when he initiates a bowel movement.  History reviewed. No pertinent past medical history.  There are no active problems to display for this patient.   Past Surgical History:  Procedure Laterality Date  . HERNIA REPAIR      Prior to Admission medications   Medication Sig Start Date End Date Taking? Authorizing Provider  Aspirin-Caffeine (BC FAST PAIN RELIEF) 845-65 MG PACK Take 1 Package by mouth 3 (three) times daily as needed (pain).    [provider]  Pseudoephedrine-APAP-DM (DAYQUIL MULTI-SYMPTOM COLD/FLU PO) Take 1 tablet by mouth daily as needed (cold/flu symptoms).    [provider]    No Known Allergies  No family history on file.  Social History Social History   Tobacco Use  . Smoking status: Current Every Day Smoker    Packs/day: 0.50    Types: Cigarettes  . Smokeless tobacco:  Never Used  Substance Use Topics  . Alcohol use: Yes  . Drug use: Yes    Types: Marijuana    Comment: occ    Review of Systems Constitutional: Negative for fever. Eyes: Negative for visual complaints ENT: Negative for congestion Cardiovascular: Negative for chest pain. Respiratory: Negative for shortness of breath. Gastrointestinal: Lower abdominal cramping yesterday, gone today.  Negative for vomiting occasional nausea.  Formed stool. Genitourinary: Negative for dysuria. Musculoskeletal: Negative for back pain. Skin: Negative for rash. Neurological: Negative for headache All other ROS negative  ____________________________________________   PHYSICAL EXAM:  VITAL SIGNS: ED Triage Vitals  Enc Vitals Group     BP 03/09/17 0642 (!) 155/85     Pulse Rate 03/09/17 0642 (!) 54     Resp 03/09/17 0642 18     Temp 03/09/17 0642 97.7 F (36.5 C)     Temp Source 03/09/17 0642 Oral     SpO2 03/09/17 0642 97 %     Weight 03/09/17 0638 170 lb (77.1 kg)     Height 03/09/17 0638 5\' 9"  (1.753 m)     Head Circumference --      Peak Flow --      Pain Score 03/09/17 0631 6     Pain Loc --      Pain Edu? --      Excl. in GC? --    Constitutional: Alert and oriented. Well appearing and in no  distress. Eyes: Normal exam ENT   Head: Normocephalic and atraumatic.   Mouth/Throat: Mucous membranes are moist. Cardiovascular: Normal rate, regular rhythm. No murmur Respiratory: Normal respiratory effort without tachypnea nor retractions. Breath sounds are clear Gastrointestinal: Soft and nontender. No distention.    Musculoskeletal: Nontender with normal range of motion in all extremities. Neurologic:  Normal speech and language. No gross focal neurologic deficits Skin:  Skin is warm, dry and intact.  Psychiatric: Mood and affect are normal.   ____________________________________________   INITIAL IMPRESSION / ASSESSMENT AND PLAN / ED COURSE  Pertinent labs & imaging results  that were available during my care of the patient were reviewed by me and considered in my medical decision making (see chart for details).  Patient presents the emergency department with concerns of rectal bleeding intermittently over the past 2 months but worse over the past 2 days.  Patient states he was seen in the emergency department several months ago for the same and was diagnosed with probable hemorrhoids.  Differential would include external hemorrhoids, internal hemorrhoids, anal fissure, lower GI bleed, cancers process.  Overall the patient appears very well, normal physical exam including nontender abdominal exam.  On rectal examination the patient does have one moderate sized external hemorrhoid, rectal exam he has light brown stool but it is guaiac positive.  Discussed with the patient the need to follow-up with GI medicine he is agreeable to this plan.  Suspect likely hemorrhoidal bleeding.  Patient's labs are reassuring, exam is reassuring.  I believe the patient is safe for discharge home with GI follow-up.  Patient is agreeable to this plan of care.  I discussed a trial of Colace twice daily for the next 2 months while awaiting GI appointment.  ____________________________________________   FINAL CLINICAL IMPRESSION(S) / ED DIAGNOSES  Rectal bleeding    Minna AntisPaduchowski, Preston Weill, MD 03/09/17 [phone]

## 2017-05-28 ENCOUNTER — Emergency Department
Admission: EM | Admit: 2017-05-28 | Discharge: 2017-05-28 | Disposition: A | Discharge status: Home / Self Care | Payer: Self-pay | Attending: Emergency Medicine | Admitting: Emergency Medicine

## 2017-05-28 ENCOUNTER — Other Ambulatory Visit: Payer: Self-pay

## 2017-05-28 ENCOUNTER — Encounter: Payer: Self-pay | Admitting: Emergency Medicine

## 2017-05-28 DIAGNOSIS — K644 Residual hemorrhoidal skin tags: Secondary | ICD-10-CM | POA: Insufficient documentation

## 2017-05-28 DIAGNOSIS — F17210 Nicotine dependence, cigarettes, uncomplicated: Secondary | ICD-10-CM | POA: Insufficient documentation

## 2017-05-28 MED ORDER — HYDROCORTISONE ACETATE 25 MG RE SUPP
25.0000 mg | Freq: Once | RECTAL | Status: AC
  Administered 2017-05-28: 25 mg via RECTAL
  Filled 2017-05-28: qty 1

## 2017-05-28 MED ORDER — HYDROCORTISONE ACETATE 25 MG RE SUPP: 25.0000 mg | Freq: Two times a day (BID) | RECTAL | 0 refills | Status: AC | PRN

## 2017-05-28 NOTE — ED Provider Notes (Addendum)
[facility] Emergency Department Provider Note    First MD Initiated Contact with Patient 05/28/17 [phone]     (approximate)  I have reviewed the triage vital signs and the nursing notes.   HISTORY  Chief Complaint Rectal Bleeding    HPI Phillip Stephenson is a 37 y.o. male presents to the emergency department with intermittent bright red blood per rectum times months patient states that symptoms recurred 2 days ago.  Patient denies any rectal bleeding today.  Patient was seen on 11/19/2015 and 03/09/2017 for the same and diagnosed with external hemorrhoids   Past medical history Inguinal hernia External hemorrhoid There are no active problems to display for this patient.   Past Surgical History:  Procedure Laterality Date  . HERNIA REPAIR      Prior to Admission medications   Medication Sig Start Date End Date Taking? Authorizing Provider  Aspirin-Caffeine (BC FAST PAIN RELIEF) 845-65 MG PACK Take 1 Package by mouth 3 (three) times daily as needed (pain).    [provider]  Pseudoephedrine-APAP-DM (DAYQUIL MULTI-SYMPTOM COLD/FLU PO) Take 1 tablet by mouth daily as needed (cold/flu symptoms).    [provider]    Allergies No known drug allergies   Family history Not contributory  Social History Social History   Tobacco Use  . Smoking status: Current Every Day Smoker    Packs/day: 0.50    Types: Cigarettes  . Smokeless tobacco: Never Used  Substance Use Topics  . Alcohol use: Yes    Comment: socially  . Drug use: Yes    Types: Marijuana    Comment: occ    Review of Systems Constitutional: No fever/chills Eyes: No visual changes. ENT: No sore throat. Cardiovascular: Denies chest pain. Respiratory: Denies shortness of breath. Gastrointestinal: No abdominal pain.  No nausea, no vomiting.  No diarrhea.  No constipation.  Positive for bright red blood per rectum Genitourinary: Negative for dysuria. Musculoskeletal:  Negative for neck pain.  Negative for back pain. Integumentary: Negative for rash. Neurological: Negative for headaches, focal weakness or numbness.   ____________________________________________   PHYSICAL EXAM:  VITAL SIGNS: ED Triage Vitals  Enc Vitals Group     BP 05/28/17 0602 (!) 136/97     Pulse Rate 05/28/17 0602 80     Resp 05/28/17 0602 18     Temp 05/28/17 0602 98 F (36.7 C)     Temp Source 05/28/17 0602 Oral     SpO2 05/28/17 0602 99 %     Weight 05/28/17 0601 86.2 kg (190 lb)     Height 05/28/17 0601 1.727 m (5\' 8" )     Head Circumference --      Peak Flow --      Pain Score 05/28/17 0600 6     Pain Loc --      Pain Edu? --      Excl. in GC? --     Constitutional: Alert and oriented. Well appearing and in no acute distress. Eyes: Conjunctivae are normal.  Head: Atraumatic. Mouth/Throat: Mucous membranes are moist.{**  Oropharynx non-erythematous. Neck: No stridor.   Cardiovascular: Normal rate, regular rhythm. Good peripheral circulation. Grossly normal heart sounds. Respiratory: Normal respiratory effort.  No retractions. Lungs CTAB. Gastrointestinal: Soft and nontender. No distention.  External hemorrhoid noted 6 o'clock position  musculoskeletal: No lower extremity tenderness nor edema. No gross deformities of extremities. Neurologic:  Normal speech and language. No gross focal neurologic deficits are appreciated.  Skin:  Skin is warm, dry  and intact. No rash noted. Psychiatric: Mood and affect are normal. Speech and behavior are normal.      Procedures   ____________________________________________   INITIAL IMPRESSION / ASSESSMENT AND PLAN / ED COURSE  As part of my medical decision making, I reviewed the following data within the electronic MEDICAL RECORD NUMBER  37 year old male presented with above-stated history physical exam consistent with external hemorrhoid patient will be referred to Dr. Excell Seltzer general surgery for outpatient management     ____________________________________________  FINAL CLINICAL IMPRESSION(S) / ED DIAGNOSES  Final diagnoses:  External hemorrhoid     MEDICATIONS GIVEN DURING THIS VISIT:  Medications - No data to display   ED Discharge Orders    None       Note:  This document was prepared using Dragon voice recognition software and may include unintentional dictation errors.    Darci Current, MD 05/28/17 1610    Darci Current, MD 05/28/17 [phone]

## 2017-05-28 NOTE — ED Notes (Signed)
ED Provider at bedside. 

## 2017-05-28 NOTE — ED Notes (Signed)

## 2017-05-28 NOTE — ED Notes (Signed)
Present for rectal exam with MD Manson PasseyBrown, external hemorrhoid present

## 2017-05-28 NOTE — ED Triage Notes (Signed)
Patient ambulatory to triage with steady gait, without difficulty or distress noted; pt reports having rectal bleeding x 2 days with lower abd pain; hx of same

## 2017-12-17 ENCOUNTER — Encounter: Payer: Self-pay | Admitting: Emergency Medicine

## 2017-12-17 ENCOUNTER — Emergency Department
Admission: EM | Admit: 2017-12-17 | Discharge: 2017-12-17 | Disposition: A | Discharge status: Home / Self Care | Payer: Self-pay | Attending: Emergency Medicine | Admitting: Emergency Medicine

## 2017-12-17 ENCOUNTER — Other Ambulatory Visit: Payer: Self-pay

## 2017-12-17 DIAGNOSIS — K0889 Other specified disorders of teeth and supporting structures: Secondary | ICD-10-CM | POA: Insufficient documentation

## 2017-12-17 DIAGNOSIS — F17210 Nicotine dependence, cigarettes, uncomplicated: Secondary | ICD-10-CM | POA: Insufficient documentation

## 2017-12-17 MED ORDER — IBUPROFEN 600 MG PO TABS: 600.0000 mg | ORAL_TABLET | Freq: Three times a day (TID) | ORAL | 0 refills | Status: DC | PRN

## 2017-12-17 MED ORDER — AMOXICILLIN 500 MG PO CAPS: 500.0000 mg | ORAL_CAPSULE | Freq: Three times a day (TID) | ORAL | 0 refills | Status: DC

## 2017-12-17 NOTE — Discharge Instructions (Addendum)
Begin taking antibiotics as directed until completely finished.  Take ibuprofen every 8 hours with food. Follow-up with 1 the clinics listed on your discharge papers.  The [facility] clinic listed on your papers also has walk-in hours.  OPTIONS FOR DENTAL FOLLOW UP CARE  [place] Department of Health and Human Services - Local Safety Net Dental Clinics [email or web address]   [facility] [phone])  Sharl Ma [phone])  [place] [phone])  [facility] [phone])  [facility] [phone]) This clinic caters to the indigent population and is on a lottery system. Location: [employer] [facility], [employer], [address], [place] Clinic Hours: Wednesdays from 6pm - 9pm, patients seen by a lottery system. For dates, call or go to [email or web address] Services: Cleanings, fillings and simple extractions. Payment Options: DENTAL WORK IS FREE OF CHARGE. Bring proof of income or support. Best way to get seen: Arrive at 5:15 pm - this is a lottery, NOT first come/first serve, so arriving earlier will not increase your chances of being seen.     [facility] [phone] Select option 1 for emergencies   Location: [facility]istry, [place], [address] [place] Clinic Hours: No walk-ins accepted - call the day before to schedule an appointment. Check in times are 9:30 am and 1:30 pm. Services: Simple extractions, temporary fillings, pulpectomy/pulp debridement, uncomplicated abscess drainage. Payment Options: PAYMENT IS DUE AT THE TIME OF SERVICE.  Fee is usually $100-200, additional surgical procedures (e.g. abscess drainage) may be extra. Cash, checks, Visa/MasterCard accepted.  Can file Medicaid if patient is covered for dental - patient should call case worker to  check. No discount for [facility] patients. Best way to get seen: MUST call the day before and get onto the schedule. Can usually be seen the next 1-2 days. No walk-ins accepted.     [facility] [phone]   Location: [facility], [address], [place] Clinic Hours: M, W, Th, F 8am or 1:30pm, Tues 9a or 1:30 - first come/first served. Services: Simple extractions, temporary fillings, uncomplicated abscess drainage.  You do not need to be an [facility] resident. Payment Options: PAYMENT IS DUE AT THE TIME OF SERVICE. Dental insurance, otherwise sliding scale - bring proof of income or support. Depending on income and treatment needed, cost is usually $50-200. Best way to get seen: Arrive early as it is first come/first served.     [facility] [phone]   Location: [address] Clinic Hours: Mon-Thu 8a-5p Services: Most basic dental services including extractions and fillings. Payment Options: PAYMENT IS DUE AT THE TIME OF SERVICE. Sliding scale, up to 50% off - bring proof if income or support. Medicaid with dental option accepted. Best way to get seen: Call to schedule an appointment, can usually be seen within 2 weeks OR they will try to see walk-ins - show up at 8a or 2p (you may have to wait).     [facility] [phone] [place] RESIDENTS ONLY   Location: [facility], [address] [place], [place] [address] Clinic Hours: By appointment only. Monday - Thursday 8am-5pm, Friday 8am-12pm Services: Cleanings, fillings, extractions. Payment Options: PAYMENT IS DUE AT THE TIME OF SERVICE. Cash, Visa or MasterCard. Sliding scale - $30 minimum per service. Best way to get seen: Come in to office, complete packet and make an appointment - need  proof of income or support monies for each household member and proof of [place]  residence. Usually takes about a month to get in.     [facility] [phone]   Location: [address] [facility] Hours: Walk-in Urgent Care Dental Services are offered Monday-Friday mornings only. The numbers of emergencies accepted daily is limited to the number of providers available. Maximum 15 - Mondays, Wednesdays & Thursdays Maximum 10 - Tuesdays & Fridays Services: You do not need to be a [place] resident to be seen for a dental emergency. Emergencies are defined as pain, swelling, abnormal bleeding, or dental trauma. Walkins will receive x-rays if needed. NOTE: Dental cleaning is not an emergency. Payment Options: PAYMENT IS DUE AT THE TIME OF SERVICE. Minimum co-pay is $40.00 for uninsured patients. Minimum co-pay is $3.00 for Medicaid with dental coverage. Dental Insurance is accepted and must be presented at time of visit. Medicare does not cover dental. Forms of payment: Cash, credit card, checks. Best way to get seen: If not previously registered with the clinic, walk-in dental registration begins at 7:15 am and is on a first come/first serve basis. If previously registered with the clinic, call to make an appointment.     [facility] [phone] [place] RESIDENTS ONLY   Location: [address], [place], [place] Clinic Hours: Mon-Thu 10a-2p Services: Extractions only! Payment Options: FREE (donations accepted) - bring proof of income or support Best way to get seen: Call and schedule an appointment OR come at 8am on the 1st Monday of every month (except for holidays) when it is first come/first served.     [facility] [phone]   Location: [address], [place] Clinic Hours: Friday mornings Services, Payment Options, Best way to get seen: Call for info

## 2017-12-17 NOTE — ED Provider Notes (Signed)
[facility] Emergency Department Provider Note   ____________________________________________   First MD Initiated Contact with Patient 12/17/17 [phone]     (approximate)  I have reviewed the triage vital signs and the nursing notes.   HISTORY  Chief Complaint Dental Pain    HPI Phillip Stephenson is a 37 y.o. male presents to the ED with complaint of right lower dental pain that began last evening.  He denies any injury to his teeth.  He also denies any fever, chills, nausea or vomiting.  He rates his pain as 6 out of 10.  History reviewed. No pertinent past medical history.  There are no active problems to display for this patient.   Past Surgical History:  Procedure Laterality Date  . HERNIA REPAIR      Prior to Admission medications   Medication Sig Start Date End Date Taking? Authorizing Provider  amoxicillin (AMOXIL) 500 MG capsule Take 1 capsule (500 mg total) by mouth 3 (three) times daily. 12/17/17   Tommi Rumps, PA-C  Aspirin-Caffeine (BC FAST PAIN RELIEF) 845-65 MG PACK Take 1 Package by mouth 3 (three) times daily as needed (pain).    [provider]  ibuprofen (ADVIL,MOTRIN) 600 MG tablet Take 1 tablet (600 mg total) by mouth every 8 (eight) hours as needed. 12/17/17   Tommi Rumps, PA-C  Pseudoephedrine-APAP-DM (DAYQUIL MULTI-SYMPTOM COLD/FLU PO) Take 1 tablet by mouth daily as needed (cold/flu symptoms).    [provider]    Allergies Patient has no known allergies.  No family history on file.  Social History Social History   Tobacco Use  . Smoking status: Current Every Day Smoker    Packs/day: 0.50    Types: Cigarettes  . Smokeless tobacco: Never Used  Substance Use Topics  . Alcohol use: Yes    Comment: socially  . Drug use: Yes    Types: Marijuana    Comment: occ    Review of Systems Constitutional: No fever/chills Eyes: No visual changes. ENT: No sore throat.  Positive for dental  pain. Cardiovascular: Denies chest pain. Respiratory: Denies shortness of breath. Skin: Negative for rash. Neurological: Negative for headaches, focal weakness or numbness. ____________________________________________   PHYSICAL EXAM:  VITAL SIGNS: ED Triage Vitals  Enc Vitals Group     BP 12/17/17 0602 (!) 131/97     Pulse Rate 12/17/17 0602 62     Resp 12/17/17 0602 16     Temp 12/17/17 0602 97.9 F (36.6 C)     Temp Source 12/17/17 0602 Oral     SpO2 12/17/17 0602 98 %     Weight 12/17/17 0559 190 lb (86.2 kg)     Height 12/17/17 0559 5\' 6"  (1.676 m)     Head Circumference --      Peak Flow --      Pain Score 12/17/17 0559 6     Pain Loc --      Pain Edu? --      Excl. in GC? --    Constitutional: Alert and oriented. Well appearing and in no acute distress. Eyes: Conjunctivae are normal.  Head: Atraumatic. Nose: No congestion/rhinnorhea. Mouth/Throat: Mucous membranes are moist.  Oropharynx non-erythematous.  Right lower premolar with a small cary present.  No gum edema.  No drainage.  Area is tender. Neck: No stridor.   Hematological/Lymphatic/Immunilogical: No cervical lymphadenopathy. Cardiovascular: Normal rate, regular rhythm. Grossly normal heart sounds.  Good peripheral circulation. Respiratory: Normal respiratory effort.  No retractions. Lungs CTAB.  Gastrointestinal: Soft and nontender. No distention. No abdominal bruits. No CVA tenderness. Musculoskeletal: No lower extremity tenderness nor edema.  No joint effusions. Neurologic:  Normal speech and language. No gross focal neurologic deficits are appreciated.  Skin:  Skin is warm, dry and intact. No rash noted. Psychiatric: Mood and affect are normal. Speech and behavior are normal.  ____________________________________________   LABS (all labs ordered are listed, but only abnormal results are displayed)  Labs Reviewed - No data to display  PROCEDURES  Procedure(s) performed:  None  Procedures  Critical Care performed: No  ____________________________________________   INITIAL IMPRESSION / ASSESSMENT AND PLAN / ED COURSE  As part of my medical decision making, I reviewed the following data within the electronic MEDICAL RECORD NUMBER Notes from prior ED visits and [place] Controlled Substance Database  Patient presents to the ED with complaint of dental pain that began yesterday.  He denies any fever or chills.  There is been no drainage from the area.  Patient has a single cary with tenderness.  Patient will start amoxicillin 500 mg 3 times daily for 10 days and ibuprofen 600 mg 3 times daily as needed for pain and discomfort.  He was given a list of dental clinics to follow-up with.  ____________________________________________   FINAL CLINICAL IMPRESSION(S) / ED DIAGNOSES  Final diagnoses:  Pain, dental     ED Discharge Orders         Ordered    amoxicillin (AMOXIL) 500 MG capsule  3 times daily     12/17/17 0715    ibuprofen (ADVIL,MOTRIN) 600 MG tablet  Every 8 hours PRN,   Status:  Discontinued     12/17/17 0715    ibuprofen (ADVIL,MOTRIN) 600 MG tablet  Every 8 hours PRN,   Status:  Discontinued     12/17/17 0717    ibuprofen (ADVIL,MOTRIN) 600 MG tablet  Every 8 hours PRN     12/17/17 0981           Note:  This document was prepared using Dragon voice recognition software and may include unintentional dictation errors.    Tommi Rumps, PA-C 12/17/17 1914    Governor Rooks, MD 12/17/17 [phone]

## 2017-12-17 NOTE — ED Triage Notes (Signed)
Patient ambulatory to triage with steady gait, without difficulty or distress noted; pt reports right lower dental pain since last night

## 2018-02-14 ENCOUNTER — Emergency Department
Admission: EM | Admit: 2018-02-14 | Discharge: 2018-02-14 | Disposition: A | Discharge status: Home / Self Care | Payer: Self-pay | Attending: Emergency Medicine | Admitting: Emergency Medicine

## 2018-02-14 ENCOUNTER — Other Ambulatory Visit: Payer: Self-pay

## 2018-02-14 ENCOUNTER — Encounter: Payer: Self-pay | Admitting: Emergency Medicine

## 2018-02-14 DIAGNOSIS — F1910 Other psychoactive substance abuse, uncomplicated: Secondary | ICD-10-CM | POA: Insufficient documentation

## 2018-02-14 DIAGNOSIS — F322 Major depressive disorder, single episode, severe without psychotic features: Secondary | ICD-10-CM | POA: Insufficient documentation

## 2018-02-14 DIAGNOSIS — Y904 Blood alcohol level of 80-99 mg/100 ml: Secondary | ICD-10-CM | POA: Insufficient documentation

## 2018-02-14 DIAGNOSIS — F1099 Alcohol use, unspecified with unspecified alcohol-induced disorder: Secondary | ICD-10-CM | POA: Insufficient documentation

## 2018-02-14 DIAGNOSIS — Z046 Encounter for general psychiatric examination, requested by authority: Secondary | ICD-10-CM | POA: Insufficient documentation

## 2018-02-14 DIAGNOSIS — Z79899 Other long term (current) drug therapy: Secondary | ICD-10-CM | POA: Insufficient documentation

## 2018-02-14 DIAGNOSIS — F17210 Nicotine dependence, cigarettes, uncomplicated: Secondary | ICD-10-CM | POA: Insufficient documentation

## 2018-02-14 LAB — CBC
HEMATOCRIT: 49.7 % (ref 39.0–52.0)
HEMOGLOBIN: 16.7 g/dL (ref 13.0–17.0)
MCH: 30.7 pg (ref 26.0–34.0)
MCHC: 33.6 g/dL (ref 30.0–36.0)
MCV: 91.4 fL (ref 80.0–100.0)
NRBC: 0 % (ref 0.0–0.2)
Platelets: 326 10*3/uL (ref 150–400)
RBC: 5.44 MIL/uL (ref 4.22–5.81)
RDW: 13.4 % (ref 11.5–15.5)
WBC: 6.3 10*3/uL (ref 4.0–10.5)

## 2018-02-14 LAB — COMPREHENSIVE METABOLIC PANEL
ALT: 45 U/L — ABNORMAL HIGH (ref 0–44)
ANION GAP: 9 (ref 5–15)
AST: 31 U/L (ref 15–41)
Albumin: 4.1 g/dL (ref 3.5–5.0)
Alkaline Phosphatase: 76 U/L (ref 38–126)
BUN: 9 mg/dL (ref 6–20)
CHLORIDE: 107 mmol/L (ref 98–111)
CO2: 23 mmol/L (ref 22–32)
Calcium: 8.8 mg/dL — ABNORMAL LOW (ref 8.9–10.3)
Creatinine, Ser: 1.14 mg/dL (ref 0.61–1.24)
Glucose, Bld: 87 mg/dL (ref 70–99)
POTASSIUM: 3.9 mmol/L (ref 3.5–5.1)
SODIUM: 139 mmol/L (ref 135–145)
Total Bilirubin: 0.7 mg/dL (ref 0.3–1.2)
Total Protein: 7 g/dL (ref 6.5–8.1)

## 2018-02-14 LAB — URINE DRUG SCREEN, QUALITATIVE (ARMC ONLY)
AMPHETAMINES, UR SCREEN: NOT DETECTED
BARBITURATES, UR SCREEN: NOT DETECTED
Benzodiazepine, Ur Scrn: NOT DETECTED
COCAINE METABOLITE, UR ~~LOC~~: POSITIVE — AB
Cannabinoid 50 Ng, Ur ~~LOC~~: POSITIVE — AB
MDMA (ECSTASY) UR SCREEN: NOT DETECTED
METHADONE SCREEN, URINE: NOT DETECTED
Opiate, Ur Screen: NOT DETECTED
Phencyclidine (PCP) Ur S: NOT DETECTED
TRICYCLIC, UR SCREEN: NOT DETECTED

## 2018-02-14 LAB — ETHANOL: ALCOHOL ETHYL (B): 90 mg/dL — AB (ref <10)

## 2018-02-14 NOTE — ED Notes (Signed)
Pt states he brought himself here voluntarily and is ready to leave. MD made aware.  Pt states he is wanting a cigarette and doesn't have any thoughts of hurting himself right now.

## 2018-02-14 NOTE — ED Notes (Signed)
Pt was "dressed out" while in triage. Items removed and bagged include one pair of shoes, one pair of socks, one pair of underwear, one pair pants, one coat, two cell phones, two lighters, one cigarette, one set of keys, one shirt, one belt.

## 2018-02-14 NOTE — Discharge Instructions (Addendum)
You have been seen in the Emergency Department (ED)  today for a psychiatric complaint.  You have been evaluated by psychiatry and we believe you are safe to be discharged from the hospital.   ° °Please return to the Emergency Department (ED)  immediately if you have ANY thoughts of hurting yourself or anyone else, so that we may help you. ° °Please avoid alcohol and drug use. ° °Follow up with your doctor and/or therapist as soon as possible regarding today's ED  visit.  ° °You may call crisis hotline for [place] at [phone]. ° °

## 2018-02-14 NOTE — ED Triage Notes (Signed)
Pt to ED via [employer] for voluntary evaluation of depression. Pt states that he has been drinking some today and was arguing with his girlfriend. Pt is calm and cooperative in triage.

## 2018-02-14 NOTE — ED Notes (Signed)
Collected red top, green top, lav top, sent to lab pending orders.

## 2018-02-14 NOTE — BH Assessment (Signed)
Writer provided the pt. with information and instructions on how to access Outpatient Mental Health & Substance Abuse Treatment ([facility] and [facility]).  Patient denies SI/HI and AV/H.  ____________ [facility] [address],  [place], [place] [address] [phone]  [facility] [address],  [place], [place] [address] [phone] 

## 2018-02-14 NOTE — ED Provider Notes (Signed)
[facility] Emergency Department Provider Note  ____________________________________________  Time seen: Approximately 3:34 PM  I have reviewed the triage vital signs and the nursing notes.   HISTORY  Chief Complaint Depression   HPI Phillip Stephenson is a 37 y.o. male with no significant past medical history who presents for evaluation of depression.  Patient reports that he has been feeling depressed for the last several months.  Today he got into an argument with his girlfriend and police was called to the scene.  Patient was told to come to the emergency room for evaluation otherwise they would take him to jail.  Patient arrives voluntarily.  He reports occasional alcohol consumption over the weekends with the last drink 2 days ago.  Endorses occasional marijuana and cocaine.  Denies IV drug use.  Denies any prior history of suicidal ideation or suicide attempts.  Denies any current suicidal homicidal ideation.  He reports that he has been having issues with his girlfriend and that is exacerbating his depression.  Has never been seen by a psychiatrist or taken any psychiatric medications.  Denies any medical problems or medical complaints at this time.  PMH Left hydrocele 03/21/2014  S/P inguinal hernia repair 10/24/2013  Testicular swelling, left 10/24/2013  S/P repair of hydrocele 10/24/2013  Cholecystitis 10/03/2013     Past Surgical History:  Procedure Laterality Date  . HERNIA REPAIR      Prior to Admission medications   Medication Sig Start Date End Date Taking? Authorizing Provider  amoxicillin (AMOXIL) 500 MG capsule Take 1 capsule (500 mg total) by mouth 3 (three) times daily. 12/17/17   Tommi Rumps, PA-C  Aspirin-Caffeine (BC FAST PAIN RELIEF) 845-65 MG PACK Take 1 Package by mouth 3 (three) times daily as needed (pain).    [provider]  ibuprofen (ADVIL,MOTRIN) 600 MG tablet Take 1 tablet (600 mg total) by mouth every 8  (eight) hours as needed. 12/17/17   Tommi Rumps, PA-C  Pseudoephedrine-APAP-DM (DAYQUIL MULTI-SYMPTOM COLD/FLU PO) Take 1 tablet by mouth daily as needed (cold/flu symptoms).    [provider]    Allergies Patient has no known allergies.  No family history on file.  Social History Social History   Tobacco Use  . Smoking status: Current Every Day Smoker    Packs/day: 0.50    Types: Cigarettes  . Smokeless tobacco: Never Used  Substance Use Topics  . Alcohol use: Yes    Comment: socially  . Drug use: Yes    Types: Marijuana    Comment: occ    Review of Systems  Constitutional: Negative for fever. Eyes: Negative for visual changes. ENT: Negative for sore throat. Neck: No neck pain  Cardiovascular: Negative for chest pain. Respiratory: Negative for shortness of breath. Gastrointestinal: Negative for abdominal pain, vomiting or diarrhea. Genitourinary: Negative for dysuria. Musculoskeletal: Negative for back pain. Skin: Negative for rash. Neurological: Negative for headaches, weakness or numbness. Psych: No SI or HI. + depression  ____________________________________________   PHYSICAL EXAM:  VITAL SIGNS: ED Triage Vitals  Enc Vitals Group     BP 02/14/18 1040 (!) 144/100     Pulse Rate 02/14/18 1040 97     Resp 02/14/18 1040 18     Temp 02/14/18 1040 (!) 97.4 F (36.3 C)     Temp Source 02/14/18 1040 Oral     SpO2 02/14/18 1040 100 %     Weight 02/14/18 1041 180 lb (81.6 kg)     Height 02/14/18  1041 5\' 9"  (1.753 m)     Head Circumference --      Peak Flow --      Pain Score 02/14/18 1108 0     Pain Loc --      Pain Edu? --      Excl. in GC? --     Constitutional: Alert and oriented. Well appearing and in no apparent distress. HEENT:      Head: Normocephalic and atraumatic.         Eyes: Conjunctivae are normal. Sclera is non-icteric.       Mouth/Throat: Mucous membranes are moist.       Neck: Supple with no signs of  meningismus. Cardiovascular: Regular rate and rhythm. No murmurs, gallops, or rubs. 2+ symmetrical distal pulses are present in all extremities. No JVD. Respiratory: Normal respiratory effort. Lungs are clear to auscultation bilaterally. No wheezes, crackles, or rhonchi.  Gastrointestinal: Soft, non tender, and non distended with positive bowel sounds. No rebound or guarding. Genitourinary: No CVA tenderness. Musculoskeletal: Nontender with normal range of motion in all extremities. No edema, cyanosis, or erythema of extremities. Neurologic: Normal speech and language. Face is symmetric. Moving all extremities. No gross focal neurologic deficits are appreciated. Skin: Skin is warm, dry and intact. No rash noted. Psychiatric: Mood and affect are normal. Speech and behavior are normal.  ____________________________________________   LABS (all labs ordered are listed, but only abnormal results are displayed)  Labs Reviewed  COMPREHENSIVE METABOLIC PANEL - Abnormal; Notable for the following components:      Result Value   Calcium 8.8 (*)    ALT 45 (*)    All other components within normal limits  ETHANOL - Abnormal; Notable for the following components:   Alcohol, Ethyl (B) 90 (*)    All other components within normal limits  URINE DRUG SCREEN, QUALITATIVE ([facility] ONLY) - Abnormal; Notable for the following components:   Cocaine Metabolite,Ur [place] POSITIVE (*)    Cannabinoid 50 Ng, Ur [place] POSITIVE (*)    All other components within normal limits  CBC   ____________________________________________  EKG  none  ____________________________________________  RADIOLOGY  none  ____________________________________________   PROCEDURES  Procedure(s) performed: None Procedures Critical Care performed:  None ____________________________________________   INITIAL IMPRESSION / ASSESSMENT AND PLAN / ED COURSE   37 y.o. male with no significant past medical history who presents for  evaluation of depression.  Patient is brought in voluntarily by police after getting into an argument with his girlfriend.  He denies suicidal homicidal ideation, no prior history of psychiatric illness, no prior suicide attempts or psychiatric hospitalizations.  At this time patient does not meet IVC criteria. Meet with Jerilynn Som from [facility] who provided outpatient resources. Patient offered a psychiatric consult in the ED but declined.  Alcohol level of 90, drug screen positive for cocaine and cannabinoids.  Patient is clinically sober.  Will discharge home with outpatient resources.  Discussed standard return precautions.      As part of my medical decision making, I reviewed the following data within the electronic MEDICAL RECORD NUMBER Nursing notes reviewed and incorporated, Labs reviewed , Old chart reviewed, A consult was requested and obtained from this/these consultant(s) [facility], Notes from prior ED visits and [place] Controlled Substance Database    Pertinent labs & imaging results that were available during my care of the patient were reviewed by me and considered in my medical decision making (see chart for details).    ____________________________________________   FINAL CLINICAL  IMPRESSION(S) / ED DIAGNOSES  Final diagnoses:  Current severe episode of major depressive disorder without psychotic features without prior episode (HCC)  Polysubstance abuse (HCC)      NEW MEDICATIONS STARTED DURING THIS VISIT:  ED Discharge Orders    None       Note:  This document was prepared using Dragon voice recognition software and may include unintentional dictation errors.    Nita SickleVeronese, Jansen, MD 02/14/18 [id number]

## 2018-02-14 NOTE — Progress Notes (Signed)
Pt discharged to home.  Pt denies SI, HI, and A/V hallucinations.  Verbalizes understanding of discharge instructions.  Refused discharge vital signs.

## 2018-02-14 NOTE — ED Triage Notes (Signed)
First Nurse Note:  Arrives with [employer] for voluntary evaluation of depression.  Patient denies SI/ HI.  States he has been arguing more frequently with girlfriend and feels his depression is worse.   Patient is calm and cooperative.
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
## 2019-07-01 ENCOUNTER — Encounter: Payer: Self-pay | Admitting: Emergency Medicine

## 2019-07-01 DIAGNOSIS — F17210 Nicotine dependence, cigarettes, uncomplicated: Secondary | ICD-10-CM | POA: Diagnosis not present

## 2019-07-01 DIAGNOSIS — K047 Periapical abscess without sinus: Secondary | ICD-10-CM | POA: Insufficient documentation

## 2019-07-01 DIAGNOSIS — Z79899 Other long term (current) drug therapy: Secondary | ICD-10-CM | POA: Insufficient documentation

## 2019-07-01 DIAGNOSIS — K0889 Other specified disorders of teeth and supporting structures: Secondary | ICD-10-CM | POA: Diagnosis present

## 2019-07-01 NOTE — ED Triage Notes (Signed)
Pt c/o right lower dental pain x1 day. Pt has several broken teeth in area. Pt denies fever and drainage.

## 2019-07-02 ENCOUNTER — Emergency Department
Admission: EM | Admit: 2019-07-02 | Discharge: 2019-07-02 | Disposition: A | Discharge status: Home / Self Care | Payer: Medicaid Other | Attending: Emergency Medicine | Admitting: Emergency Medicine

## 2019-07-02 DIAGNOSIS — K047 Periapical abscess without sinus: Secondary | ICD-10-CM

## 2019-07-02 MED ORDER — CLINDAMYCIN HCL 300 MG PO CAPS: 300.0000 mg | ORAL_CAPSULE | Freq: Three times a day (TID) | ORAL | 0 refills | Status: AC

## 2019-07-02 MED ORDER — LIDOCAINE HCL (PF) 2 % IJ SOLN
10.0000 mL | Freq: Once | INTRAMUSCULAR | Status: DC
  Filled 2019-07-02: qty 10

## 2019-07-02 MED ORDER — KETOROLAC TROMETHAMINE 30 MG/ML IJ SOLN
30.0000 mg | Freq: Once | INTRAMUSCULAR | Status: AC
  Administered 2019-07-02: 02:00:00 30 mg via INTRAMUSCULAR
  Filled 2019-07-02: qty 1

## 2019-07-02 MED ORDER — CLINDAMYCIN HCL 300 MG PO CAPS: 300.0000 mg | ORAL_CAPSULE | Freq: Three times a day (TID) | ORAL | 0 refills | Status: DC

## 2019-07-02 MED ORDER — CLINDAMYCIN HCL 150 MG PO CAPS
300.0000 mg | ORAL_CAPSULE | Freq: Once | ORAL | Status: AC
  Administered 2019-07-02: 04:00:00 300 mg via ORAL
  Filled 2019-07-02: qty 2

## 2019-07-02 NOTE — ED Provider Notes (Signed)
[facility] Emergency Department Provider Note   ____________________________________________   First MD Initiated Contact with Patient 07/02/19 0120     (approximate)  I have reviewed the triage vital signs and the nursing notes.   HISTORY  Chief Complaint Dental Pain    HPI Phillip Stephenson is a 39 y.o. male with no significant past medical history who presents to the ED complaining of dental pain.  Patient reports that he has noticed pain and swelling to the area of his right lower premolars for the past 24 hours.  Pain has not been alleviated by Tylenol or ibuprofen and he has noticed increased swelling to his right jaw.  He denies any fevers, difficulty opening his jaw, or difficulty swallowing.  He has a dentist appointment scheduled for the end of next week but is worried he cannot make it until then.        History reviewed. No pertinent past medical history.  There are no problems to display for this patient.   Past Surgical History:  Procedure Laterality Date  . HERNIA REPAIR      Prior to Admission medications   Medication Sig Start Date End Date Taking? Authorizing Provider  amoxicillin (AMOXIL) 500 MG capsule Take 1 capsule (500 mg total) by mouth 3 (three) times daily. 12/17/17   Johnn Hai, PA-C  Aspirin-Caffeine (BC FAST PAIN RELIEF) 845-65 MG PACK Take 1 Package by mouth 3 (three) times daily as needed (pain).    [provider]  clindamycin (CLEOCIN) 300 MG capsule Take 1 capsule (300 mg total) by mouth 3 (three) times daily for 7 days. 07/02/19 07/09/19  Blake Divine, MD  ibuprofen (ADVIL,MOTRIN) 600 MG tablet Take 1 tablet (600 mg total) by mouth every 8 (eight) hours as needed. 12/17/17   Johnn Hai, PA-C  Pseudoephedrine-APAP-DM (DAYQUIL MULTI-SYMPTOM COLD/FLU PO) Take 1 tablet by mouth daily as needed (cold/flu symptoms).    [provider]    Allergies Patient has no known  allergies.  History reviewed. No pertinent family history.  Social History Social History   Tobacco Use  . Smoking status: Current Every Day Smoker    Packs/day: 0.50    Types: Cigarettes  . Smokeless tobacco: Never Used  Substance Use Topics  . Alcohol use: Yes    Comment: socially  . Drug use: Yes    Types: Marijuana    Comment: occ    Review of Systems  Constitutional: No fever/chills Eyes: No visual changes. ENT: No sore throat.  Positive for dental pain and swelling. Cardiovascular: Denies chest pain. Respiratory: Denies shortness of breath. Gastrointestinal: No abdominal pain.  No nausea, no vomiting.  No diarrhea.  No constipation. Genitourinary: Negative for dysuria. Musculoskeletal: Negative for back pain. Skin: Negative for rash. Neurological: Negative for headaches, focal weakness or numbness.  ____________________________________________   PHYSICAL EXAM:  VITAL SIGNS: ED Triage Vitals [07/01/19 2340]  Enc Vitals Group     BP (!) 137/98     Pulse Rate 69     Resp 14     Temp 97.9 F (36.6 C)     Temp src      SpO2 100 %     Weight      Height      Head Circumference      Peak Flow      Pain Score      Pain Loc      Pain Edu?      Excl. in [place]?  Constitutional: Alert and oriented. Eyes: Conjunctivae are normal. Head: Atraumatic. Nose: No congestion/rhinnorhea. Mouth/Throat: Mucous membranes are moist.  Tenderness and fluctuance to gumline just below right lower premolar.  No trismus noted, submandibular compartments soft. Neck: Normal ROM Cardiovascular: Normal rate, regular rhythm. Grossly normal heart sounds. Respiratory: Normal respiratory effort.  No retractions. Lungs CTAB. Gastrointestinal: Soft and nontender. No distention. Genitourinary: deferred Musculoskeletal: No lower extremity tenderness nor edema. Neurologic:  Normal speech and language. No gross focal neurologic deficits are appreciated. Skin:  Skin is warm, dry and  intact. No rash noted. Psychiatric: Mood and affect are normal. Speech and behavior are normal.  ____________________________________________   LABS (all labs ordered are listed, but only abnormal results are displayed)  Labs Reviewed - No data to display   PROCEDURES  Procedure(s) performed (including Critical Care):  Marland KitchenMarland KitchenIncision and Drainage  Date/Time: 07/02/2019 3:52 AM Performed by: Chesley Noon, MD Authorized by: Chesley Noon, MD   Consent:    Consent obtained:  Verbal   Consent given by:  Patient   Risks discussed:  Bleeding, incomplete drainage, pain, infection and damage to other organs   Alternatives discussed:  No treatment and alternative treatment Location:    Type:  Abscess   Location:  Mouth   Mouth location:  Alveolar process Anesthesia (see MAR for exact dosages):    Anesthesia method:  Nerve block   Block location:  Inferior alveolar   Block needle gauge:  24 G   Block anesthetic:  Lidocaine 1% w/o epi   Block injection procedure:  Anatomic landmarks identified, introduced needle, incremental injection, anatomic landmarks palpated and negative aspiration for blood   Block outcome:  Anesthesia achieved Procedure type:    Complexity:  Simple Procedure details:    Needle aspiration: no     Incision types:  Stab incision   Scalpel blade:  11   Drainage:  Bloody   Drainage amount:  Scant Post-procedure details:    Patient tolerance of procedure:  Tolerated well, no immediate complications     ____________________________________________   INITIAL IMPRESSION / ASSESSMENT AND PLAN / ED COURSE       39 year old male presents to the ED with pain and swelling to the area of his right lower premolar for the past 24 hours.  He appears to have a small dental abscess to this area and inferior alveolar block was performed with improvement in pain.  Incision of small abscess performed with primarily bloody drainage as well as scant amount of purulence.   Patient was given initial dose of clindamycin as well as IM Toradol for pain.  He was counseled to follow-up with dentistry and otherwise return to the ED for new or worsening symptoms, patient agrees with plan.      ____________________________________________   FINAL CLINICAL IMPRESSION(S) / ED DIAGNOSES  Final diagnoses:  Dental abscess     ED Discharge Orders         Ordered    clindamycin (CLEOCIN) 300 MG capsule  3 times daily     07/02/19 0350           Note:  This document was prepared using Dragon voice recognition software and may include unintentional dictation errors.   Chesley Noon, MD 07/02/19 [phone]

## 2019-07-02 NOTE — ED Notes (Signed)
ED Provider at bedside. 

## 2020-01-23 ENCOUNTER — Emergency Department: Payer: Medicaid Other

## 2020-01-23 ENCOUNTER — Other Ambulatory Visit: Payer: Self-pay

## 2020-01-23 ENCOUNTER — Encounter: Payer: Self-pay | Admitting: *Deleted

## 2020-01-23 ENCOUNTER — Emergency Department
Admission: EM | Admit: 2020-01-23 | Discharge: 2020-01-23 | Disposition: A | Discharge status: Home / Self Care | Payer: Medicaid Other | Attending: Emergency Medicine | Admitting: Emergency Medicine

## 2020-01-23 DIAGNOSIS — S161XXA Strain of muscle, fascia and tendon at neck level, initial encounter: Secondary | ICD-10-CM

## 2020-01-23 DIAGNOSIS — S7001XA Contusion of right hip, initial encounter: Secondary | ICD-10-CM | POA: Diagnosis not present

## 2020-01-23 DIAGNOSIS — S7011XA Contusion of right thigh, initial encounter: Secondary | ICD-10-CM | POA: Insufficient documentation

## 2020-01-23 DIAGNOSIS — F17210 Nicotine dependence, cigarettes, uncomplicated: Secondary | ICD-10-CM | POA: Insufficient documentation

## 2020-01-23 DIAGNOSIS — S199XXA Unspecified injury of neck, initial encounter: Secondary | ICD-10-CM | POA: Diagnosis present

## 2020-01-23 DIAGNOSIS — Y92410 Unspecified street and highway as the place of occurrence of the external cause: Secondary | ICD-10-CM | POA: Diagnosis not present

## 2020-01-23 MED ORDER — BACLOFEN 10 MG PO TABS
10.0000 mg | ORAL_TABLET | Freq: Three times a day (TID) | ORAL | 0 refills | Status: AC
Start: 2020-01-23 — End: 2020-02-02

## 2020-01-23 MED ORDER — MELOXICAM 15 MG PO TABS: 15.0000 mg | ORAL_TABLET | Freq: Every day | ORAL | 2 refills | Status: AC

## 2020-01-23 MED ORDER — IBUPROFEN 600 MG PO TABS
600.0000 mg | ORAL_TABLET | Freq: Once | ORAL | Status: AC
  Administered 2020-01-23: 600 mg via ORAL
  Filled 2020-01-23: qty 1

## 2020-01-23 NOTE — Discharge Instructions (Addendum)
Follow-up with [facility] orthopedics if not improving in 1 week.  Return emergency department if worsening.  Apply ice to all areas that hurt.  Take the meloxicam starting tomorrow.  You take this once daily.  The muscle relaxer is 3 times daily.  If it makes you drowsy please do not operate heavy machinery while taking this medication.  Return as needed.
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
## 2022-03-03 DIAGNOSIS — A64 Unspecified sexually transmitted disease: Secondary | ICD-10-CM

## 2022-03-03 HISTORY — DX: Unspecified sexually transmitted disease: A64

## 2022-04-30 ENCOUNTER — Other Ambulatory Visit: Payer: Self-pay

## 2022-04-30 ENCOUNTER — Emergency Department
Admission: EM | Admit: 2022-04-30 | Discharge: 2022-04-30 | Disposition: A | Discharge status: Home / Self Care | Payer: Medicaid Other | Attending: Emergency Medicine | Admitting: Emergency Medicine

## 2022-04-30 DIAGNOSIS — A549 Gonococcal infection, unspecified: Secondary | ICD-10-CM | POA: Insufficient documentation

## 2022-04-30 DIAGNOSIS — R369 Urethral discharge, unspecified: Secondary | ICD-10-CM | POA: Diagnosis present

## 2022-04-30 DIAGNOSIS — A749 Chlamydial infection, unspecified: Secondary | ICD-10-CM | POA: Diagnosis not present

## 2022-04-30 DIAGNOSIS — Z202 Contact with and (suspected) exposure to infections with a predominantly sexual mode of transmission: Secondary | ICD-10-CM

## 2022-04-30 DIAGNOSIS — N342 Other urethritis: Secondary | ICD-10-CM

## 2022-04-30 LAB — URINALYSIS, ROUTINE W REFLEX MICROSCOPIC
Bilirubin Urine: NEGATIVE
Glucose, UA: NEGATIVE mg/dL
Ketones, ur: 5 mg/dL — AB
Nitrite: NEGATIVE
Protein, ur: 100 mg/dL — AB
Specific Gravity, Urine: 1.029 (ref 1.005–1.030)
Squamous Epithelial / HPF: NONE SEEN /HPF (ref 0–5)
WBC, UA: >50 WBC/hpf (ref 0–5)
pH: 5 (ref 5.0–8.0)

## 2022-04-30 MED ORDER — AZITHROMYCIN 500 MG PO TABS
1000.0000 mg | ORAL_TABLET | Freq: Once | ORAL | Status: AC
  Administered 2022-04-30: 1000 mg via ORAL
  Filled 2022-04-30: qty 2

## 2022-04-30 MED ORDER — CEFTRIAXONE SODIUM 500 MG IJ SOLR
500.0000 mg | Freq: Once | INTRAMUSCULAR | Status: AC
  Administered 2022-04-30: 500 mg via INTRAMUSCULAR
  Filled 2022-04-30 (×2): qty 500

## 2022-04-30 MED ORDER — METRONIDAZOLE 500 MG PO TABS
2000.0000 mg | ORAL_TABLET | Freq: Once | ORAL | Status: AC
  Administered 2022-04-30: 2000 mg via ORAL
  Filled 2022-04-30: qty 4

## 2022-04-30 NOTE — Discharge Instructions (Signed)
You have been treated in the ED for gonorrhea, chlamydia, and trichomoniasis.  You should avoid sexual contact with any untreated partners and to all partners have been treated and are symptom-free.  Follow-up with one of the local primary providers for ongoing medical care.  Please go to the following website to schedule new (and existing) patient appointments:   [email or web address]   The following is a list of primary care offices in the area who are accepting new patients at this time.  Please reach out to one of them directly and let them know you would like to schedule an appointment to follow up on an Emergency Department visit, and/or to establish a new primary care provider (PCP).  There are likely other primary care clinics in the are who are accepting new patients, but this is an excellent place to start:  [facility] physician: Dr Lavon Paganini [address] [place], [place] [address] [phone]  [facility] Lead Physician: Dr Steele Sizer [address], [place], [place] [address] [phone]  [facility] Physician: Dr Park Liter [address] [place], [place] [address] [phone]  [facility] Lead Physician: Dr Dewaine Oats [address] [place], [place] [address] [phone]  [facility] Physician: Dr Halina Maidens [address] [place], [place], [place] [address] [phone]

## 2022-04-30 NOTE — ED Provider Notes (Signed)
[facility] Emergency Department Provider Note     Event Date/Time   First MD Initiated Contact with Patient 04/30/22 2221     (approximate)   History   Dysuria and Penile Discharge   HPI  Phillip Stephenson is a 42 y.o. male presents to the ED for evaluation of dysuria described as burning with urination as well as purulent penile discharge.  Patient reports symptoms over the last week.  He reports onset after he had unprotected tensional encounter with a new partner.  Denies any fevers, chills, or sweats.  Denies any hematuria or urinary retention.     Physical Exam   Triage Vital Signs: ED Triage Vitals  Enc Vitals Group     BP 04/30/22 2120 (!) 135/117     Pulse Rate 04/30/22 2120 98     Resp 04/30/22 2120 20     Temp 04/30/22 2120 98.7 F (37.1 C)     Temp Source 04/30/22 2120 Oral     SpO2 04/30/22 2120 98 %     Weight 04/30/22 2121 170 lb (77.1 kg)     Height 04/30/22 2121 '5\' 8"'$  (1.727 m)     Head Circumference --      Peak Flow --      Pain Score 04/30/22 2120 7     Pain Loc --      Pain Edu? --      Excl. in [place]? --     Most recent vital signs: Vitals:   04/30/22 2120  BP: (!) 135/117  Pulse: 98  Resp: 20  Temp: 98.7 F (37.1 C)  SpO2: 98%    General Awake, no distress. NAD HEENT NCAT. PERRL. EOMI. No rhinorrhea. Mucous membranes are moist.  CV:  Good peripheral perfusion.  RESP:  Normal effort.  ABD:  No distention.  GU:  deferred   ED Results / Procedures / Treatments   Labs (all labs ordered are listed, but only abnormal results are displayed) Labs Reviewed  URINALYSIS, ROUTINE W REFLEX MICROSCOPIC - Abnormal; Notable for the following components:      Result Value   Color, Urine YELLOW (*)    APPearance TURBID (*)    Hgb urine dipstick SMALL (*)    Ketones, ur 5 (*)    Protein, ur 100 (*)    Leukocytes,Ua LARGE (*)    Bacteria, UA RARE (*)    All other components within normal limits  CHLAMYDIA/NGC RT  PCR ([facility] ONLY)               EKG    RADIOLOGY  No results found.   PROCEDURES:  Critical Care performed: No  Procedures   MEDICATIONS ORDERED IN ED: Medications  cefTRIAXone (ROCEPHIN) injection 500 mg (has no administration in time range)  metroNIDAZOLE (FLAGYL) tablet 2,000 mg (has no administration in time range)  azithromycin (ZITHROMAX) tablet 1,000 mg (1,000 mg Oral Given 04/30/22 2232)     IMPRESSION / MDM / ASSESSMENT AND PLAN / ED COURSE  I reviewed the triage vital signs and the nursing notes.                              Differential diagnosis includes, but is not limited to, gonorrhea, chlamydia, trichomoniasis, NG ureteritis, UTI  Patient's presentation is most consistent with acute complicated illness / injury requiring diagnostic workup.  Patient's diagnosis is consistent with urethritis.  Patient treated empirically in the  ED for gonorrhea, chlamydia, and trichomoniasis.  Patient is to follow up with a local primary provider for ongoing routine medical care, as needed or otherwise directed. Patient is given ED precautions to return to the ED for any worsening or new symptoms.     FINAL CLINICAL IMPRESSION(S) / ED DIAGNOSES   Final diagnoses:  Urethritis  Encounter for assessment of STD exposure     Rx / DC Orders   ED Discharge Orders     None        Note:  This document was prepared using Dragon voice recognition software and may include unintentional dictation errors.    Melvenia Needles, PA-C 04/30/22 2236    Naaman Plummer, MD 04/30/22 [phone]

## 2022-04-30 NOTE — ED Triage Notes (Signed)
Patient reports burning with urination as well as green and white penile discharge for the past week. Reports new sexual partner. Denies fever or chills. Denies blood in urine, denies flank pain. AOX4, resp even, unlabored on RA. Ambulatory with steady gait.

## 2022-05-01 ENCOUNTER — Telehealth: Payer: Self-pay | Admitting: Emergency Medicine

## 2022-05-01 LAB — CHLAMYDIA/NGC RT PCR (ARMC ONLY)
Chlamydia Tr: DETECTED — AB
N gonorrhoeae: DETECTED — AB

## 2022-05-01 NOTE — Telephone Encounter (Signed)
Called patient to inform of std results positive for gonorrhea and chlamydia.  He says his partner was here as well and was treated.  He understands to wait 2 weeks before having sexual relations.

## 2023-03-04 DIAGNOSIS — R030 Elevated blood-pressure reading, without diagnosis of hypertension: Secondary | ICD-10-CM

## 2023-03-04 HISTORY — DX: Elevated blood-pressure reading, without diagnosis of hypertension: R03.0

## 2023-09-01 DIAGNOSIS — R748 Abnormal levels of other serum enzymes: Secondary | ICD-10-CM

## 2023-09-01 DIAGNOSIS — E8721 Acute metabolic acidosis: Secondary | ICD-10-CM

## 2023-09-01 DIAGNOSIS — S82852A Displaced trimalleolar fracture of left lower leg, initial encounter for closed fracture: Secondary | ICD-10-CM

## 2023-09-01 HISTORY — DX: Acute metabolic acidosis: E87.21

## 2023-09-01 HISTORY — DX: Displaced trimalleolar fracture of left lower leg, initial encounter for closed fracture: S82.852A

## 2023-09-01 HISTORY — DX: Abnormal levels of other serum enzymes: R74.8

## 2023-09-21 ENCOUNTER — Emergency Department
Admission: EM | Admit: 2023-09-21 | Discharge: 2023-09-21 | Disposition: A | Discharge status: Home / Self Care | Attending: Emergency Medicine | Admitting: Emergency Medicine

## 2023-09-21 ENCOUNTER — Other Ambulatory Visit: Payer: Self-pay

## 2023-09-21 ENCOUNTER — Emergency Department

## 2023-09-21 DIAGNOSIS — Y909 Presence of alcohol in blood, level not specified: Secondary | ICD-10-CM | POA: Insufficient documentation

## 2023-09-21 DIAGNOSIS — F1090 Alcohol use, unspecified, uncomplicated: Secondary | ICD-10-CM | POA: Insufficient documentation

## 2023-09-21 DIAGNOSIS — S82852A Displaced trimalleolar fracture of left lower leg, initial encounter for closed fracture: Secondary | ICD-10-CM | POA: Diagnosis not present

## 2023-09-21 DIAGNOSIS — Y9367 Activity, basketball: Secondary | ICD-10-CM | POA: Insufficient documentation

## 2023-09-21 DIAGNOSIS — X509XXA Other and unspecified overexertion or strenuous movements or postures, initial encounter: Secondary | ICD-10-CM | POA: Diagnosis not present

## 2023-09-21 DIAGNOSIS — S99912A Unspecified injury of left ankle, initial encounter: Secondary | ICD-10-CM | POA: Diagnosis present

## 2023-09-21 LAB — CBC WITH DIFFERENTIAL/PLATELET
Abs Immature Granulocytes: 0 K/uL (ref 0.00–0.07)
Band Neutrophils: 2 %
Basophils Absolute: 0 K/uL (ref 0.0–0.1)
Basophils Relative: 0 %
Eosinophils Absolute: 0 K/uL (ref 0.0–0.5)
Eosinophils Relative: 0 %
HCT: 48.3 % (ref 39.0–52.0)
Hemoglobin: 16.5 g/dL (ref 13.0–17.0)
Lymphocytes Relative: 17 %
Lymphs Abs: 2.5 K/uL (ref 0.7–4.0)
MCH: 31.5 pg (ref 26.0–34.0)
MCHC: 34.2 g/dL (ref 30.0–36.0)
MCV: 92.2 fL (ref 80.0–100.0)
Monocytes Absolute: 2 K/uL — ABNORMAL HIGH (ref 0.1–1.0)
Monocytes Relative: 14 %
Neutro Abs: 10 K/uL — ABNORMAL HIGH (ref 1.7–7.7)
Neutrophils Relative %: 67 %
Platelets: 337 K/uL (ref 150–400)
RBC: 5.24 MIL/uL (ref 4.22–5.81)
RDW: 15 % (ref 11.5–15.5)
Smear Review: NORMAL
WBC: 14.5 K/uL — ABNORMAL HIGH (ref 4.0–10.5)
nRBC: 0 % (ref 0.0–0.2)

## 2023-09-21 LAB — COMPREHENSIVE METABOLIC PANEL WITH GFR
ALT: 47 U/L — ABNORMAL HIGH (ref 0–44)
AST: 134 U/L — ABNORMAL HIGH (ref 15–41)
Albumin: 4.4 g/dL (ref 3.5–5.0)
Alkaline Phosphatase: 82 U/L (ref 38–126)
Anion gap: 21 — ABNORMAL HIGH (ref 5–15)
BUN: 11 mg/dL (ref 6–20)
CO2: 21 mmol/L — ABNORMAL LOW (ref 22–32)
Calcium: 9.8 mg/dL (ref 8.9–10.3)
Chloride: 94 mmol/L — ABNORMAL LOW (ref 98–111)
Creatinine, Ser: 1.22 mg/dL (ref 0.61–1.24)
GFR, Estimated: >60 mL/min (ref >60)
Glucose, Bld: 108 mg/dL — ABNORMAL HIGH (ref 70–99)
Potassium: 4.2 mmol/L (ref 3.5–5.1)
Sodium: 136 mmol/L (ref 135–145)
Total Bilirubin: 3.3 mg/dL — ABNORMAL HIGH (ref 0.0–1.2)
Total Protein: 7.5 g/dL (ref 6.5–8.1)

## 2023-09-21 LAB — MAGNESIUM: Magnesium: 1.6 mg/dL — ABNORMAL LOW (ref 1.7–2.4)

## 2023-09-21 LAB — CBG MONITORING, ED: Glucose-Capillary: 100 mg/dL — ABNORMAL HIGH (ref 70–99)

## 2023-09-21 MED ORDER — IBUPROFEN 800 MG PO TABS
800.0000 mg | ORAL_TABLET | Freq: Once | ORAL | Status: AC
  Administered 2023-09-21: 800 mg via ORAL
  Filled 2023-09-21: qty 1

## 2023-09-21 MED ORDER — SODIUM CHLORIDE 0.9 % IV BOLUS (SEPSIS)
1000.0000 mL | Freq: Once | INTRAVENOUS | Status: AC
  Administered 2023-09-21: 1000 mL via INTRAVENOUS

## 2023-09-21 MED ORDER — ONDANSETRON 4 MG PO TBDP
4.0000 mg | ORAL_TABLET | Freq: Once | ORAL | Status: AC
  Administered 2023-09-21: 4 mg via ORAL
  Filled 2023-09-21: qty 1

## 2023-09-21 MED ORDER — MULTI-VITAMIN/MINERALS PO TABS: 1.0000 | ORAL_TABLET | Freq: Every day | ORAL | 3 refills | Status: AC

## 2023-09-21 MED ORDER — ONDANSETRON 4 MG PO TBDP: 4.0000 mg | ORAL_TABLET | Freq: Four times a day (QID) | ORAL | 0 refills | Status: AC | PRN

## 2023-09-21 MED ORDER — FOLIC ACID 1 MG PO TABS: 1.0000 mg | ORAL_TABLET | Freq: Every day | ORAL | 3 refills | Status: AC

## 2023-09-21 MED ORDER — MORPHINE SULFATE (PF) 4 MG/ML IV SOLN
4.0000 mg | Freq: Once | INTRAVENOUS | Status: AC
  Administered 2023-09-21: 4 mg via INTRAVENOUS
  Filled 2023-09-21: qty 1

## 2023-09-21 MED ORDER — MAGNESIUM SULFATE 2 GM/50ML IV SOLN
2.0000 g | Freq: Once | INTRAVENOUS | Status: AC
  Administered 2023-09-21: 2 g via INTRAVENOUS
  Filled 2023-09-21: qty 50

## 2023-09-21 MED ORDER — OXYCODONE-ACETAMINOPHEN 5-325 MG PO TABS
2.0000 | ORAL_TABLET | Freq: Once | ORAL | Status: AC
  Administered 2023-09-21: 2 via ORAL
  Filled 2023-09-21: qty 2

## 2023-09-21 MED ORDER — THIAMINE HCL 100 MG/ML IJ SOLN
100.0000 mg | Freq: Once | INTRAMUSCULAR | Status: AC
  Administered 2023-09-21: 100 mg via INTRAVENOUS
  Filled 2023-09-21: qty 2

## 2023-09-21 MED ORDER — IBUPROFEN 800 MG PO TABS: 800.0000 mg | ORAL_TABLET | Freq: Three times a day (TID) | ORAL | 0 refills | Status: DC | PRN

## 2023-09-21 MED ORDER — OXYCODONE-ACETAMINOPHEN 5-325 MG PO TABS: 2.0000 | ORAL_TABLET | Freq: Three times a day (TID) | ORAL | 0 refills | Status: DC | PRN

## 2023-09-21 MED ORDER — THIAMINE HCL 100 MG PO TABS: 100.0000 mg | ORAL_TABLET | Freq: Every day | ORAL | 3 refills | Status: AC

## 2023-09-21 MED ORDER — LORAZEPAM 2 MG/ML IJ SOLN
1.0000 mg | Freq: Once | INTRAMUSCULAR | Status: AC
  Administered 2023-09-21: 1 mg via INTRAVENOUS
  Filled 2023-09-21: qty 1

## 2023-09-21 NOTE — ED Triage Notes (Addendum)
 Pt presented to ED via POV with c/o left ankle pain and swelling starting yesterday after rolling ankle while playing basketball. States has not taken any OTC medication PTA. Sensation intact. Pain 10/10. Redness noted halfway up calf. Pt vomiting in triage.

## 2023-09-21 NOTE — Discharge Instructions (Addendum)

## 2023-09-21 NOTE — ED Provider Notes (Signed)
 [facility] Provider Note    Event Date/Time   First MD Initiated Contact with Patient 09/21/23 0400     (approximate)   History   Ankle Pain   HPI  Phillip Stephenson is a 43 y.o. male with history of alcohol use disorder who presents to the emergency department with a left ankle injury that occurred on Saturday.  States that he was drinking alcohol and took ecstasy and was playing basketball and rolled his ankle.  He did not fall to the ground but did not hit his head, lose consciousness and denies any other injury.  He states he has not been able to bear weight on his left leg since this happened.   History provided by patient, significant other.    History reviewed. No pertinent past medical history.  Past Surgical History:  Procedure Laterality Date   HERNIA REPAIR      MEDICATIONS:  Prior to Admission medications   Medication Sig Start Date End Date Taking? Authorizing Provider  Aspirin-Caffeine (BC FAST PAIN RELIEF) 845-65 MG PACK Take 1 Package by mouth 3 (three) times daily as needed (pain).    [provider]  Pseudoephedrine-APAP-DM (DAYQUIL MULTI-SYMPTOM COLD/FLU PO) Take 1 tablet by mouth daily as needed (cold/flu symptoms).    [provider]    Physical Exam   Triage Vital Signs: ED Triage Vitals  Encounter Vitals Group     BP 09/21/23 0357 (!) 141/99     Girls Systolic BP Percentile --      Girls Diastolic BP Percentile --      Boys Systolic BP Percentile --      Boys Diastolic BP Percentile --      Pulse Rate 09/21/23 0357 (!) 107     Resp 09/21/23 0357 20     Temp 09/21/23 0357 99.1 F (37.3 C)     Temp Source 09/21/23 0357 Oral     SpO2 09/21/23 0357 100 %     Weight 09/21/23 0358 170 lb (77.1 kg)     Height 09/21/23 0358 5' 7 (1.702 m)     Head Circumference --      Peak Flow --      Pain Score 09/21/23 0358 10     Pain Loc --      Pain Education --      Exclude from Growth Chart --     Most  recent vital signs: Vitals:   09/21/23 0600 09/21/23 0630  BP: 127/76 138/85  Pulse: 63 67  Resp: (!) 21 18  Temp:    SpO2: 98% 98%     CONSTITUTIONAL: Alert and responds appropriately to questions. Well-appearing; well-nourished HEAD: Normocephalic, atraumatic EYES: Conjunctivae clear, pupils appear equal ENT: normal nose; moist mucous membranes NECK: Normal range of motion CARD: Regular rate and rhythm RESP: Normal chest excursion without splinting or tachypnea; no hypoxia or respiratory distress, speaking full sentences ABD/GI: non-distended EXT: Patient is tender to palpation over the left ankle diffusely with soft tissue swelling but no open wounds.  No tenderness over the left proximal fibula.  Compartments of the left leg are soft.  He has a 2+ left DP pulse.  Normal range of motion in his toes, normal capillary refill and normal sensation.  No calf tenderness. SKIN: Normal color for age and race, no rashes on exposed skin NEURO: Moves all extremities equally, normal speech, no facial asymmetry noted PSYCH: The patient's mood and manner are appropriate. Grooming and personal hygiene are  appropriate.  ED Results / Procedures / Treatments   LABS: (all labs ordered are listed, but only abnormal results are displayed) Labs Reviewed  CBC WITH DIFFERENTIAL/PLATELET - Abnormal; Notable for the following components:      Result Value   WBC 14.5 (*)    Neutro Abs 10.0 (*)    Monocytes Absolute 2.0 (*)    All other components within normal limits  COMPREHENSIVE METABOLIC PANEL WITH GFR - Abnormal; Notable for the following components:   Chloride 94 (*)    CO2 21 (*)    Glucose, Bld 108 (*)    AST 134 (*)    ALT 47 (*)    Total Bilirubin 3.3 (*)    Anion gap 21 (*)    All other components within normal limits  MAGNESIUM  - Abnormal; Notable for the following components:   Magnesium  1.6 (*)    All other components within normal limits  CBG MONITORING, ED - Abnormal; Notable  for the following components:   Glucose-Capillary 100 (*)    All other components within normal limits     EKG:  EKG Interpretation Date/Time:  Monday September 21 2023 04:37:54 EDT Ventricular Rate:  99 PR Interval:  139 QRS Duration:  99 QT Interval:  357 QTC Calculation: 459 R Axis:   154  Text Interpretation: Sinus rhythm LAE, consider biatrial enlargement Right axis deviation Confirmed by Neomi Neptune [phone]) on 09/21/2023 4:38:56 AM          RADIOLOGY: My personal review and interpretation of imaging: Patient has a trimalleolar left ankle fracture.  I have personally reviewed all radiology reports. DG Ankle Complete Left Result Date: 09/21/2023 CLINICAL DATA:  43 year old male status post basketball injury yesterday. Pain and swelling. EXAM: LEFT ANKLE COMPLETE - 3+ VIEW COMPARISON:  None Available. FINDINGS: Three views 0430 hours. Trimalleolar left ankle fracture. Oblique or spiral fracture of the distal left fibula metadiaphysis is about 4 cm in length. Mildly comminuted and distracted fracture of the medial malleolus. Comminuted, minimally displaced fracture of the posterior malleolus. Posterior and mild lateral mortise joint subluxation. Talar dome appears intact. Regional soft tissue swelling. Calcaneus appears intact. IMPRESSION: Trimalleolar left ankle fracture with comminution and posterior, mild lateral mortise joint subluxation. Electronically Signed   By: VEAR Hurst M.D.   On: 09/21/2023 04:58     PROCEDURES:  Critical Care performed: No      .1-3 Lead EKG Interpretation  Performed by: Raul Torrance, Neptune SAILOR, DO Authorized by: Glenna Brunkow, Neptune SAILOR, DO     Interpretation: normal     ECG rate:  67   ECG rate assessment: normal     Rhythm: sinus rhythm     Ectopy: none     Conduction: normal       IMPRESSION / MDM / ASSESSMENT AND PLAN / ED COURSE  I reviewed the triage vital signs and the nursing notes.   Patient here with left ankle  injury.     DIFFERENTIAL DIAGNOSIS (includes but not limited to):   Fracture, sprain, dislocation.  No sign of compartment syndrome.  Patient's presentation is most consistent with acute complicated illness / injury requiring diagnostic workup.  PLAN: Will obtain x-ray of the left ankle.  Will give pain and nausea medicine.  Will keep leg elevated, apply ice.  He is neurovascular intact distally.  Denies any other injury.   MEDICATIONS GIVEN IN ED: Medications  oxyCODONE -acetaminophen  (PERCOCET/ROXICET) 5-325 MG per tablet 2 tablet (2 tablets Oral Given 09/21/23 0418)  ibuprofen  (ADVIL )  tablet 800 mg (800 mg Oral Given 09/21/23 0419)  ondansetron  (ZOFRAN -ODT) disintegrating tablet 4 mg (4 mg Oral Given 09/21/23 0420)  sodium chloride  0.9 % bolus 1,000 mL (0 mLs Intravenous Stopped 09/21/23 0642)  LORazepam  (ATIVAN ) injection 1 mg (1 mg Intravenous Given 09/21/23 0502)  thiamine  (VITAMIN B1) injection 100 mg (100 mg Intravenous Given 09/21/23 0453)  morphine  (PF) 4 MG/ML injection 4 mg (4 mg Intravenous Given 09/21/23 0600)  magnesium  sulfate IVPB 2 g 50 mL (2 g Intravenous New Bag/Given 09/21/23 0559)     ED COURSE: I was contacted by nursing staff that patient is diaphoretic, anxious, cramping of his upper extremities.  On my evaluation, he is tachycardic, hypertensive, appears uncomfortable and very anxious.  He states that symptoms started immediately after taking the oral medication.  I doubt that he is having an allergic reaction or an adverse reaction but rather a panic attack and is hyperventilating causing him to have cramping of his upper extremities.  Will check labs today, EKG.  He is not having any chest pain or shortness of breath.  No other sign of injury on exam.  Will give IV fluids, Ativan .   On reevaluation, patient is much more comfortable, vital signs have improved.  No ischemia on EKG.  Patient does have elevated liver function test consistent with alcohol use disorder but  denies upper abdominal pain and his abdominal exam is benign.  No tenderness in the right upper quadrant.  No guarding or rebound.  He also has a mild metabolic acidosis likely from alcoholic ketoacidosis.  He did receive IV fluids here and has been able to tolerate p.o.  X-ray reviewed and interpreted by myself and the radiologist and shows a trimalleolar fracture.  Will place him in a posterior short leg splint with stirrups.  Will discuss with podiatry for further recommendations.   CONSULTS: Discussed with Dr. Thresa Sar with podiatry.  Appreciate his help.  He will have his office call the patient this morning to set up close outpatient follow-up as patient will need surgical intervention as an outpatient.  Patient and significant other are comfortable with this plan.  Pain has been well-controlled here.  Recommended patient remain nonweightbearing, keeping his leg elevated when at rest, following up with podiatry.  Will discharge with pain medication.  I recommended he decrease his alcohol intake.  Received IV thiamine  here.  Will discharge with prescriptions for multivitamins, thiamine , folic acid .   At this time, I do not feel there is any life-threatening condition present. I reviewed all nursing notes, vitals, pertinent previous records.  All lab and urine results, EKGs, imaging ordered have been independently reviewed and interpreted by myself.  I reviewed all available radiology reports from any imaging ordered this visit.  Based on my assessment, I feel the patient is safe to be discharged home without further emergent workup and can continue workup as an outpatient as needed. Discussed all findings, treatment plan as well as usual and customary return precautions.  They verbalize understanding and are comfortable with this plan.  Outpatient follow-up has been provided as needed.  All questions have been answered.    OUTSIDE RECORDS REVIEWED: Reviewed last family medicine note in  2016.     FINAL CLINICAL IMPRESSION(S) / ED DIAGNOSES   Final diagnoses:  Left trimalleolar fracture, closed, initial encounter  Alcohol use disorder     Rx / DC Orders   ED Discharge Orders          Ordered  oxyCODONE -acetaminophen  (PERCOCET) 5-325 MG tablet  Every 8 hours PRN        09/21/23 0634    ibuprofen  (ADVIL ) 800 MG tablet  Every 8 hours PRN        09/21/23 0634    ondansetron  (ZOFRAN -ODT) 4 MG disintegrating tablet  Every 6 hours PRN        09/21/23 0634    Multiple Vitamins-Minerals (MULTIVITAMIN WITH MINERALS) tablet  Daily        09/21/23 0701    thiamine  (VITAMIN B1) 100 MG tablet  Daily        09/21/23 0701    folic acid  (FOLVITE ) 1 MG tablet  Daily        09/21/23 0701             Note:  This document was prepared using Dragon voice recognition software and may include unintentional dictation errors.   Ronte Parker, Josette SAILOR, DO 09/21/23 [phone]

## 2023-09-22 ENCOUNTER — Other Ambulatory Visit: Payer: Self-pay | Admitting: Podiatry

## 2023-09-22 ENCOUNTER — Ambulatory Visit: Payer: Self-pay | Admitting: Podiatry

## 2023-09-22 DIAGNOSIS — S82892A Other fracture of left lower leg, initial encounter for closed fracture: Secondary | ICD-10-CM

## 2023-09-24 ENCOUNTER — Ambulatory Visit
Admission: RE | Admit: 2023-09-24 | Discharge: 2023-09-24 | Disposition: A | Discharge status: Home / Self Care | Source: Ambulatory Visit | Attending: Podiatry

## 2023-09-24 DIAGNOSIS — S82892A Other fracture of left lower leg, initial encounter for closed fracture: Secondary | ICD-10-CM

## 2023-09-25 ENCOUNTER — Encounter: Payer: Self-pay | Admitting: Podiatry

## 2023-09-25 ENCOUNTER — Ambulatory Visit: Admitting: Podiatry

## 2023-09-25 VITALS — Ht 67.0 in | Wt 170.0 lb

## 2023-09-25 DIAGNOSIS — S82852A Displaced trimalleolar fracture of left lower leg, initial encounter for closed fracture: Secondary | ICD-10-CM | POA: Diagnosis not present

## 2023-09-25 MED ORDER — OXYCODONE-ACETAMINOPHEN 5-325 MG PO TABS: 2.0000 | ORAL_TABLET | Freq: Three times a day (TID) | ORAL | 0 refills | Status: DC | PRN

## 2023-09-25 NOTE — H&P (View-Only) (Signed)
 Chief Complaint  Patient presents with   Ankle Injury    Pt is here due to recent left ankle injury states he was playing basketball and fell, was seen in the ED for this reason has had x-rays and CT scan of ankle and was told that it is fractured, pt has swelling to the foot, ankle and leg. Blisters has formed on the ankle as well. He is using crutches to ambulate.    HPI: 43 y.o. male presenting today as a new patient referral from [facility] ED for evaluation of left ankle fracture.  DOI: 09/21/2023.  Patient was on ecstasy playing basketball with his friends when he fell and broke his ankle.  Currently he is NWB in a posterior splint  No past medical history on file.  Past Surgical History:  Procedure Laterality Date   HERNIA REPAIR      No Known Allergies   Physical Exam: General: The patient is alert and oriented x3 in no acute distress.  Dermatology: Multiple serous fracture blisters noted diffusely around the ankle.  Moderate edema into the leg.    Vascular: Palpable pedal pulses bilaterally. Capillary refill within normal limits.  No erythema.  Moderate edema noted diffusely throughout the foot ankle and leg  Neurological: Grossly intact via light touch  Musculoskeletal Exam: Instability and ankle fracture.  Currently NWB using crutches and a posterior splint. No calf pain.  Calf is soft and supple.  Clinically no concern for compartment syndrome  DG Ankle Complete Left 09/21/2023 FINDINGS: Three views 0430 hours. Trimalleolar left ankle fracture. Oblique or spiral fracture of the distal left fibula metadiaphysis is about 4 cm in length. Mildly comminuted and distracted fracture of the medial malleolus. Comminuted, minimally displaced fracture of the posterior malleolus. Posterior and mild lateral mortise joint subluxation. Talar dome appears intact. Regional soft tissue swelling. Calcaneus appears intact.   IMPRESSION: Trimalleolar left ankle fracture with comminution and  posterior, mild lateral mortise joint subluxation.  CT ANKLE LEFT WO CONTRAST 09/24/2023 FINDINGS: Bones/Joint/Cartilage   Acute trimalleolar fracture of the left ankle. Obliquely oriented, comminuted fracture of the distal fibular metadiaphysis with mild posterolateral displacement. Comminuted fracture involving the medial malleolus. The anterior aspect of the medial malleolus is slightly displaced anteriorly. Vertically oriented posterior malleolar fracture that extends peripherally through the medial malleolus. There is posterior and superior displacement resulting in up to 6 mm of articular-surface depression posteriorly. Posterior subluxation of the talar dome relative to the distal tibia. Tibiotalar joint hemarthrosis with multiple small intra-articular fracture fragments. Bones of the midfoot and hindfoot are intact without fracture. Joint spaces are preserved.  IMPRESSION: 1. Acute trimalleolar fracture-subluxation of the left ankle, as described above. 2. Tibiotalar joint hemarthrosis with multiple small intra-articular fracture fragments.   Assessment/Plan of Care: 1.  Trimalleolar ankle fracture left, closed, displaced, initial encounter  -Patient evaluated.  X-rays and pertinent medical history reviewed -Today we discussed necessity for open reduction with internal fixation of the left ankle fracture due to the displacement and instability of the joint.  Risk benefits advantages and disadvantages the procedure were explained in detail.  No guarantees were expressed or implied.  All patient questions were answered.  Postoperative recovery course was also explained.  He understands that he will be nonweightbearing for about 8 weeks postoperatively -Also discussed recreational drug use.  Patient is adamant that he has discontinued any recreational drug use.  He also denies current alcohol intake. -Refill prescription for Percocet 5/325 mg every 6 hours as needed pain -  Cam  boot dispensed.  Continue NWB in the cam boot using the crutches -Plan for surgery 10/02/2023 at [facility] hospital -Return to clinic 1 week postop  *partner is [place] (Qu[place])     Thresa EMERSON Sar, DPM [facility]  Dr. Thresa EMERSON Sar, DPM    [address] [place], [place] [address]                Office [phone]  Fax [phone]

## 2023-09-25 NOTE — Progress Notes (Signed)
 Chief Complaint  Patient presents with   Ankle Injury    Pt is here due to recent left ankle injury states he was playing basketball and fell, was seen in the ED for this reason has had x-rays and CT scan of ankle and was told that it is fractured, pt has swelling to the foot, ankle and leg. Blisters has formed on the ankle as well. He is using crutches to ambulate.    HPI: 43 y.o. male presenting today as a new patient referral from [facility] ED for evaluation of left ankle fracture.  DOI: 09/21/2023.  Patient was on ecstasy playing basketball with his friends when he fell and broke his ankle.  Currently he is NWB in a posterior splint  No past medical history on file.  Past Surgical History:  Procedure Laterality Date   HERNIA REPAIR      No Known Allergies   Physical Exam: General: The patient is alert and oriented x3 in no acute distress.  Dermatology: Multiple serous fracture blisters noted diffusely around the ankle.  Moderate edema into the leg.    Vascular: Palpable pedal pulses bilaterally. Capillary refill within normal limits.  No erythema.  Moderate edema noted diffusely throughout the foot ankle and leg  Neurological: Grossly intact via light touch  Musculoskeletal Exam: Instability and ankle fracture.  Currently NWB using crutches and a posterior splint. No calf pain.  Calf is soft and supple.  Clinically no concern for compartment syndrome  DG Ankle Complete Left 09/21/2023 FINDINGS: Three views 0430 hours. Trimalleolar left ankle fracture. Oblique or spiral fracture of the distal left fibula metadiaphysis is about 4 cm in length. Mildly comminuted and distracted fracture of the medial malleolus. Comminuted, minimally displaced fracture of the posterior malleolus. Posterior and mild lateral mortise joint subluxation. Talar dome appears intact. Regional soft tissue swelling. Calcaneus appears intact.   IMPRESSION: Trimalleolar left ankle fracture with comminution and  posterior, mild lateral mortise joint subluxation.  CT ANKLE LEFT WO CONTRAST 09/24/2023 FINDINGS: Bones/Joint/Cartilage   Acute trimalleolar fracture of the left ankle. Obliquely oriented, comminuted fracture of the distal fibular metadiaphysis with mild posterolateral displacement. Comminuted fracture involving the medial malleolus. The anterior aspect of the medial malleolus is slightly displaced anteriorly. Vertically oriented posterior malleolar fracture that extends peripherally through the medial malleolus. There is posterior and superior displacement resulting in up to 6 mm of articular-surface depression posteriorly. Posterior subluxation of the talar dome relative to the distal tibia. Tibiotalar joint hemarthrosis with multiple small intra-articular fracture fragments. Bones of the midfoot and hindfoot are intact without fracture. Joint spaces are preserved.  IMPRESSION: 1. Acute trimalleolar fracture-subluxation of the left ankle, as described above. 2. Tibiotalar joint hemarthrosis with multiple small intra-articular fracture fragments.   Assessment/Plan of Care: 1.  Trimalleolar ankle fracture left, closed, displaced, initial encounter  -Patient evaluated.  X-rays and pertinent medical history reviewed -Today we discussed necessity for open reduction with internal fixation of the left ankle fracture due to the displacement and instability of the joint.  Risk benefits advantages and disadvantages the procedure were explained in detail.  No guarantees were expressed or implied.  All patient questions were answered.  Postoperative recovery course was also explained.  He understands that he will be nonweightbearing for about 8 weeks postoperatively -Also discussed recreational drug use.  Patient is adamant that he has discontinued any recreational drug use.  He also denies current alcohol intake. -Refill prescription for Percocet 5/325 mg every 6 hours as needed pain -  Cam  boot dispensed.  Continue NWB in the cam boot using the crutches -Plan for surgery 10/02/2023 at [facility] hospital -Return to clinic 1 week postop  *partner is [place] ([place])     Thresa EMERSON Sar, DPM [facility]  Dr. Thresa EMERSON Sar, DPM    [address] [place], [place] [address]                Office [phone]  Fax [phone]

## 2023-09-25 NOTE — Addendum Note (Signed)
 Addended by: JANIT THRESA HERO on: 09/25/2023 09:05 AM   Modules accepted: Orders

## 2023-09-28 ENCOUNTER — Telehealth: Payer: Self-pay | Admitting: Podiatry

## 2023-09-28 NOTE — Telephone Encounter (Signed)
 DOS- 10/02/2023  OPEN TREATMENT OF A TRIMALLEOLAR ANKLE FRACTURE LT- 72177  [employer] EFFECTIVE DATE- 07/01/2021  PER [employer] WEBSITE, NO PRIOR AUTH IS REQUIRED FOR CPT CODE 72177. DOCUMENTATION ATTACHED TO CONSENT PACKET

## 2023-09-28 NOTE — Telephone Encounter (Signed)
 Called pt and he is scheduled for surgery 8/1 at 230pm at [facility] and is aware. [facility] in [place] is his preferred pharmacy.I moved it to 1st

## 2023-09-30 ENCOUNTER — Other Ambulatory Visit: Payer: Self-pay

## 2023-09-30 ENCOUNTER — Encounter
Admission: RE | Admit: 2023-09-30 | Discharge: 2023-09-30 | Disposition: A | Discharge status: Home / Self Care | Source: Ambulatory Visit | Attending: Podiatry | Admitting: Podiatry

## 2023-09-30 HISTORY — DX: Other psychoactive substance abuse, uncomplicated: F19.10

## 2023-09-30 HISTORY — DX: Alcohol abuse, uncomplicated: F10.10

## 2023-09-30 NOTE — Patient Instructions (Addendum)
 Your procedure is scheduled on: 10/02/23 - Friday Report to the Registration Desk on the 1st floor of the [facility]. To find out your arrival time, please call [phone] between 1PM - 3PM on: 10/01/23 - Thursday If your arrival time is 6:00 am, do not arrive before that time as the Medical Mall entrance doors do not open until 6:00 am.  REMEMBER: Instructions that are not followed completely may result in serious medical risk, up to and including death; or upon the discretion of your surgeon and anesthesiologist your surgery may need to be rescheduled.  Do not eat food or drink any liquids after midnight the night before surgery.  No gum chewing or hard candies.   One week prior to surgery: Stop Anti-inflammatories (NSAIDS) such as Advil , Aleve, Ibuprofen , Motrin , Naproxen, Naprosyn and Aspirin based products such as Excedrin, Goody's Powder, BC Powder.You may continue to take Tylenol  if needed for pain up until the day of surgery.   Stop ANY OVER THE COUNTER supplements until after surgery.  ON THE DAY OF SURGERY ONLY TAKE THESE MEDICATIONS WITH SIPS OF WATER:  oxyCODONE -acetaminophen  if needed   No Alcohol for 24 hours before or after surgery.  No Smoking including e-cigarettes for 24 hours before surgery.  No chewable tobacco products for at least 6 hours before surgery.  No nicotine patches on the day of surgery.  Do not use any recreational drugs for at least a week (preferably 2 weeks) before your surgery.  Please be advised that the combination of cocaine and anesthesia may have negative outcomes, up to and including death. If you test positive for cocaine, your surgery will be cancelled.  On the morning of surgery brush your teeth with toothpaste and water, you may rinse your mouth with mouthwash if you wish. Do not swallow any toothpaste or mouthwash.  Use CHG Soap or wipes as directed on instruction sheet.  Do not wear jewelry, make-up, hairpins, clips or nail  polish.  For welded (permanent) jewelry: bracelets, anklets, waist bands, etc.  Please have this removed prior to surgery.  If it is not removed, there is a chance that hospital personnel will need to cut it off on the day of surgery.  Do not wear lotions, powders, or perfumes.   Do not shave body hair from the neck down 48 hours before surgery.  Contact lenses, hearing aids and dentures may not be worn into surgery.  Do not bring valuables to the hospital. [facility] is not responsible for any missing/lost belongings or valuables.   Notify your doctor if there is any change in your medical condition (cold, fever, infection).  Wear comfortable clothing (specific to your surgery type) to the hospital.  After surgery, you can help prevent lung complications by doing breathing exercises.  Take deep breaths and cough every 1-2 hours. Your doctor may order a device called an Incentive Spirometer to help you take deep breaths. When coughing or sneezing, hold a pillow firmly against your incision with both hands. This is called "splinting." Doing this helps protect your incision. It also decreases belly discomfort.  If you are being admitted to the hospital overnight, leave your suitcase in the car. After surgery it may be brought to your room.  In case of increased patient census, it may be necessary for you, the patient, to continue your postoperative care in the Same Day Surgery department.  If you are being discharged the day of surgery, you will not be allowed to drive home.  You will need a responsible individual to drive you home and stay with you for 24 hours after surgery.   If you are taking public transportation, you will need to have a responsible individual with you.  Please call the Pre-admissions Testing Dept. at [phone] if you have any questions about these instructions.  Surgery Visitation Policy:  Patients having surgery or a procedure may have two visitors.   Children under the age of 40 must have an adult with them who is not the patient.  Inpatient Visitation:    Visiting hours are 7 a.m. to 8 p.m. Up to four visitors are allowed at one time in a patient room. The visitors may rotate out with other people during the day.  One visitor age 58 or older may stay with the patient overnight and must be in the room by 8 p.m.   Merchandiser, retail to address health-related social needs:  [email or web address]

## 2023-10-02 ENCOUNTER — Encounter: Payer: Self-pay | Admitting: Podiatry

## 2023-10-02 ENCOUNTER — Ambulatory Visit
Admission: RE | Admit: 2023-10-02 | Discharge: 2023-10-02 | Disposition: A | Discharge status: Home / Self Care | Attending: Podiatry | Admitting: Podiatry

## 2023-10-02 ENCOUNTER — Ambulatory Visit

## 2023-10-02 ENCOUNTER — Ambulatory Visit: Payer: Self-pay | Admitting: Anesthesiology

## 2023-10-02 ENCOUNTER — Other Ambulatory Visit: Payer: Self-pay

## 2023-10-02 ENCOUNTER — Encounter
Admission: RE | Disposition: A | Discharge status: Home / Self Care | Payer: Self-pay | Source: Home / Self Care | Attending: Podiatry

## 2023-10-02 DIAGNOSIS — Y9367 Activity, basketball: Secondary | ICD-10-CM | POA: Insufficient documentation

## 2023-10-02 DIAGNOSIS — W1830XA Fall on same level, unspecified, initial encounter: Secondary | ICD-10-CM | POA: Insufficient documentation

## 2023-10-02 DIAGNOSIS — S82852A Displaced trimalleolar fracture of left lower leg, initial encounter for closed fracture: Secondary | ICD-10-CM

## 2023-10-02 DIAGNOSIS — Z01818 Encounter for other preprocedural examination: Secondary | ICD-10-CM

## 2023-10-02 HISTORY — PX: ORIF ANKLE FRACTURE: SHX5408

## 2023-10-02 SURGERY — OPEN REDUCTION INTERNAL FIXATION (ORIF) ANKLE FRACTURE
Anesthesia: General | Site: Ankle | Laterality: Left

## 2023-10-02 MED ORDER — CELECOXIB 200 MG PO CAPS
200.0000 mg | ORAL_CAPSULE | Freq: Once | ORAL | Status: AC
  Administered 2023-10-02: 200 mg via ORAL

## 2023-10-02 MED ORDER — ONDANSETRON HCL 4 MG/2ML IJ SOLN
INTRAMUSCULAR | Status: DC | PRN
  Administered 2023-10-02: 4 mg via INTRAVENOUS

## 2023-10-02 MED ORDER — ACETAMINOPHEN 500 MG PO TABS
1000.0000 mg | ORAL_TABLET | Freq: Once | ORAL | Status: AC
  Administered 2023-10-02: 1000 mg via ORAL

## 2023-10-02 MED ORDER — FENTANYL CITRATE (PF) 100 MCG/2ML IJ SOLN
INTRAMUSCULAR | Status: AC
Start: 2023-10-02 — End: 2023-10-02
  Filled 2023-10-02: qty 2

## 2023-10-02 MED ORDER — OXYCODONE-ACETAMINOPHEN 5-325 MG PO TABS: 2.0000 | ORAL_TABLET | Freq: Once | ORAL | Status: DC

## 2023-10-02 MED ORDER — GABAPENTIN 300 MG PO CAPS
ORAL_CAPSULE | ORAL | Status: AC
  Filled 2023-10-02: qty 1

## 2023-10-02 MED ORDER — MIDAZOLAM HCL 2 MG/2ML IJ SOLN
INTRAMUSCULAR | Status: DC | PRN
Start: 2023-10-02 — End: 2023-10-02
  Administered 2023-10-02: 2 mg via INTRAVENOUS

## 2023-10-02 MED ORDER — PROPOFOL 10 MG/ML IV BOLUS
INTRAVENOUS | Status: AC
Start: 2023-10-02 — End: 2023-10-02
  Filled 2023-10-02: qty 20

## 2023-10-02 MED ORDER — DEXMEDETOMIDINE HCL IN NACL 80 MCG/20ML IV SOLN
INTRAVENOUS | Status: DC | PRN
  Administered 2023-10-02 (×8): 4 ug via INTRAVENOUS

## 2023-10-02 MED ORDER — OXYCODONE-ACETAMINOPHEN 5-325 MG PO TABS: 1.0000 | ORAL_TABLET | ORAL | 0 refills | Status: DC | PRN

## 2023-10-02 MED ORDER — 0.9 % SODIUM CHLORIDE (POUR BTL) OPTIME
TOPICAL | Status: DC | PRN
  Administered 2023-10-02: 500 mL
  Administered 2023-10-02: 1000 mL

## 2023-10-02 MED ORDER — OXYCODONE HCL 5 MG PO TABS
ORAL_TABLET | ORAL | Status: AC
  Filled 2023-10-02: qty 1

## 2023-10-02 MED ORDER — CEFAZOLIN SODIUM-DEXTROSE 3-4 GM/150ML-% IV SOLN
3.0000 g | INTRAVENOUS | Status: AC
  Administered 2023-10-02: 3 g via INTRAVENOUS
  Filled 2023-10-02: qty 150

## 2023-10-02 MED ORDER — ACETAMINOPHEN 10 MG/ML IV SOLN: 1000.0000 mg | Freq: Once | INTRAVENOUS | Status: DC | PRN

## 2023-10-02 MED ORDER — BUPIVACAINE HCL (PF) 0.5 % IJ SOLN
INTRAMUSCULAR | Status: DC | PRN
Start: 2023-10-02 — End: 2023-10-02
  Administered 2023-10-02: 25 mL

## 2023-10-02 MED ORDER — OXYCODONE HCL 5 MG/5ML PO SOLN: 5.0000 mg | Freq: Once | ORAL | Status: AC | PRN

## 2023-10-02 MED ORDER — MIDAZOLAM HCL 2 MG/2ML IJ SOLN
INTRAMUSCULAR | Status: AC
Start: 2023-10-02 — End: 2023-10-02
  Filled 2023-10-02: qty 2

## 2023-10-02 MED ORDER — HYDROMORPHONE HCL 1 MG/ML IJ SOLN
INTRAMUSCULAR | Status: AC
  Filled 2023-10-02: qty 1

## 2023-10-02 MED ORDER — CEFAZOLIN SODIUM-DEXTROSE 2-4 GM/100ML-% IV SOLN
INTRAVENOUS | Status: AC
Start: 2023-10-02 — End: 2023-10-02
  Filled 2023-10-02: qty 100

## 2023-10-02 MED ORDER — CHLORHEXIDINE GLUCONATE 0.12 % MT SOLN
15.0000 mL | Freq: Once | OROMUCOSAL | Status: AC
  Administered 2023-10-02: 15 mL via OROMUCOSAL

## 2023-10-02 MED ORDER — ACETAMINOPHEN 500 MG PO TABS
ORAL_TABLET | ORAL | Status: AC
  Filled 2023-10-02: qty 2

## 2023-10-02 MED ORDER — DEXAMETHASONE SODIUM PHOSPHATE 10 MG/ML IJ SOLN
INTRAMUSCULAR | Status: DC | PRN
  Administered 2023-10-02: 10 mg via INTRAVENOUS

## 2023-10-02 MED ORDER — BUPIVACAINE HCL (PF) 0.5 % IJ SOLN
INTRAMUSCULAR | Status: AC
  Filled 2023-10-02: qty 30

## 2023-10-02 MED ORDER — HYDROMORPHONE HCL 1 MG/ML IJ SOLN
INTRAMUSCULAR | Status: DC | PRN
  Administered 2023-10-02 (×2): .5 mg via INTRAVENOUS

## 2023-10-02 MED ORDER — OXYCODONE HCL 5 MG PO TABS
5.0000 mg | ORAL_TABLET | Freq: Once | ORAL | Status: AC | PRN
  Administered 2023-10-02: 5 mg via ORAL

## 2023-10-02 MED ORDER — GABAPENTIN 300 MG PO CAPS
300.0000 mg | ORAL_CAPSULE | Freq: Once | ORAL | Status: AC
  Administered 2023-10-02: 300 mg via ORAL

## 2023-10-02 MED ORDER — FENTANYL CITRATE (PF) 100 MCG/2ML IJ SOLN
INTRAMUSCULAR | Status: DC | PRN
  Administered 2023-10-02: 50 ug via INTRAVENOUS
  Administered 2023-10-02: 25 ug via INTRAVENOUS
  Administered 2023-10-02 (×2): 50 ug via INTRAVENOUS
  Administered 2023-10-02: 25 ug via INTRAVENOUS

## 2023-10-02 MED ORDER — PROPOFOL 10 MG/ML IV BOLUS
INTRAVENOUS | Status: DC | PRN
  Administered 2023-10-02 (×3): 30 mg via INTRAVENOUS
  Administered 2023-10-02 (×2): 40 mg via INTRAVENOUS
  Administered 2023-10-02: 30 mg via INTRAVENOUS
  Administered 2023-10-02: 200 mg via INTRAVENOUS
  Administered 2023-10-02: 30 mg via INTRAVENOUS
  Administered 2023-10-02: 20 mg via INTRAVENOUS
  Administered 2023-10-02 (×3): 40 mg via INTRAVENOUS
  Administered 2023-10-02: 30 mg via INTRAVENOUS

## 2023-10-02 MED ORDER — PROPOFOL 10 MG/ML IV BOLUS
INTRAVENOUS | Status: AC
  Filled 2023-10-02: qty 20

## 2023-10-02 MED ORDER — LIDOCAINE HCL (CARDIAC) PF 100 MG/5ML IV SOSY
PREFILLED_SYRINGE | INTRAVENOUS | Status: DC | PRN
  Administered 2023-10-02: 80 mg via INTRAVENOUS

## 2023-10-02 MED ORDER — FENTANYL CITRATE (PF) 100 MCG/2ML IJ SOLN
INTRAMUSCULAR | Status: AC
  Filled 2023-10-02: qty 2

## 2023-10-02 MED ORDER — LIDOCAINE HCL (PF) 1 % IJ SOLN
INTRAMUSCULAR | Status: AC
  Filled 2023-10-02: qty 30

## 2023-10-02 MED ORDER — DROPERIDOL 2.5 MG/ML IJ SOLN: 0.6250 mg | Freq: Once | INTRAMUSCULAR | Status: DC | PRN

## 2023-10-02 MED ORDER — CHLORHEXIDINE GLUCONATE 0.12 % MT SOLN
OROMUCOSAL | Status: AC
Start: 2023-10-02 — End: 2023-10-02
  Filled 2023-10-02: qty 15

## 2023-10-02 MED ORDER — LACTATED RINGERS IV SOLN: INTRAVENOUS | Status: DC

## 2023-10-02 MED ORDER — ORAL CARE MOUTH RINSE: 15.0000 mL | Freq: Once | OROMUCOSAL | Status: AC

## 2023-10-02 MED ORDER — FENTANYL CITRATE (PF) 100 MCG/2ML IJ SOLN
25.0000 ug | INTRAMUSCULAR | Status: DC | PRN
  Administered 2023-10-02: 50 ug via INTRAVENOUS

## 2023-10-02 MED ORDER — OXYCODONE-ACETAMINOPHEN 5-325 MG PO TABS
ORAL_TABLET | ORAL | Status: AC
Start: 2023-10-02 — End: 2023-10-02
  Filled 2023-10-02: qty 2

## 2023-10-02 MED ORDER — CELECOXIB 200 MG PO CAPS
ORAL_CAPSULE | ORAL | Status: AC
  Filled 2023-10-02: qty 1

## 2023-10-02 SURGICAL SUPPLY — 61 items
BASIN KIT SINGLE STR (MISCELLANEOUS) IMPLANT
BIT DRILL 2.0X130 SLD AO (BIT) IMPLANT
BIT DRILL 2.4X180 SLD AO (BIT) IMPLANT
BIT DRILL CANNULTD 2.6 X 130MM (DRILL) IMPLANT
BNDG COHESIVE 4X5 TAN STRL LF (GAUZE/BANDAGES/DRESSINGS) ×1 IMPLANT
BNDG ELASTIC 4X5.8 VLCR NS LF (GAUZE/BANDAGES/DRESSINGS) ×1 IMPLANT
BNDG ESMARCH 4X12 STRL LF (GAUZE/BANDAGES/DRESSINGS) ×1 IMPLANT
BNDG GAUZE DERMACEA FLUFF 4 (GAUZE/BANDAGES/DRESSINGS) ×1 IMPLANT
CHLORAPREP W/TINT 26 (MISCELLANEOUS) ×1 IMPLANT
CUFF TOURN SGL QUICK 18X4 (TOURNIQUET CUFF) IMPLANT
CUFF TRNQT CYL 24X4X16.5-23 (TOURNIQUET CUFF) IMPLANT
DRAPE C-ARM 42X70 (DRAPES) IMPLANT
DRAPE C-ARMOR (DRAPES) IMPLANT
DRAPE FLUOR MINI C-ARM 54X84 (DRAPES) IMPLANT
ELECTRODE REM PT RTRN 9FT ADLT (ELECTROSURGICAL) ×1 IMPLANT
GAUZE SPONGE 4X4 12PLY STRL (GAUZE/BANDAGES/DRESSINGS) ×1 IMPLANT
GAUZE XEROFORM 1X8 LF (GAUZE/BANDAGES/DRESSINGS) ×1 IMPLANT
GAUZE XEROFORM 5X9 LF (GAUZE/BANDAGES/DRESSINGS) IMPLANT
GLOVE BIO SURGEON STRL SZ8 (GLOVE) ×1 IMPLANT
GLOVE BIOGEL PI IND STRL 8 (GLOVE) ×1 IMPLANT
GLOVE SURG SYN 8.5 PF PI (GLOVE) IMPLANT
GOWN STRL REUS W/ TWL LRG LVL3 (GOWN DISPOSABLE) ×1 IMPLANT
GOWN STRL REUS W/ TWL XL LVL3 (GOWN DISPOSABLE) ×1 IMPLANT
HANDLE YANKAUER SUCT BULB TIP (MISCELLANEOUS) ×1 IMPLANT
KIT TURNOVER KIT A (KITS) ×1 IMPLANT
KWIRE SNGL END 1.2X150 (MISCELLANEOUS) IMPLANT
LABEL OR SOLS (LABEL) ×1 IMPLANT
MANIFOLD NEPTUNE II (INSTRUMENTS) ×1 IMPLANT
NDL HYPO 25X1 1.5 SAFETY (NEEDLE) ×2 IMPLANT
NEEDLE HYPO 25X1 1.5 SAFETY (NEEDLE) ×2 IMPLANT
NS IRRIG 1000ML POUR BTL (IV SOLUTION) IMPLANT
NS IRRIG 500ML POUR BTL (IV SOLUTION) ×1 IMPLANT
PACK EXTREMITY ARMC (MISCELLANEOUS) ×1 IMPLANT
PAD ABD DERMACEA PRESS 5X9 (GAUZE/BANDAGES/DRESSINGS) ×1 IMPLANT
PAD PREP OB/GYN DISP 24X41 (PERSONAL CARE ITEMS) ×1 IMPLANT
PENCIL SMOKE EVACUATOR (MISCELLANEOUS) ×1 IMPLANT
PLATE FIB CL 9H LT (Plate) IMPLANT
PLATE MEDIAL MALLEOLUS 4H HOOK (Plate) IMPLANT
SCREW CANN HEAD LT 4.0X46 (Screw) IMPLANT
SCREW COUNTERSINK 4.0 HEADED (MISCELLANEOUS) IMPLANT
SCREW LOCK PLATE R3 2.7X14 (Screw) IMPLANT
SCREW LOCK PLATE R3 2.7X15 (Screw) IMPLANT
SCREW LOCK PLATE R3 2.7X16 (Screw) IMPLANT
SCREW LOCK PLATE R3 2.7X17 (Screw) IMPLANT
SCREW LOCK PLATE R3 2.7X30 (Screw) IMPLANT
SCREW NON LOCK PLATE 2.7X16M (Screw) IMPLANT
SCREW NON LOCKING PLATE 2.7X14 (Screw) IMPLANT
SOLUTION PREP PVP 2OZ (MISCELLANEOUS) ×1 IMPLANT
SPONGE T-LAP 18X18 ~~LOC~~+RFID (SPONGE) ×1 IMPLANT
STAPLER SKIN PROX 35W (STAPLE) IMPLANT
STOCKINETTE M/LG 89821 (MISCELLANEOUS) ×1 IMPLANT
STRAP SAFETY 5IN WIDE (MISCELLANEOUS) ×1 IMPLANT
SUT VIC AB 3-0 SH 27X BRD (SUTURE) IMPLANT
SUT VIC AB 4-0 PS2 27 (SUTURE) ×1 IMPLANT
SUT VICRYL AB 3-0 FS1 BRD 27IN (SUTURE) ×1 IMPLANT
SUTURE VICRYL 4-0 27 PS-2 BART (SUTURE) ×1 IMPLANT
SYR 10ML LL (SYRINGE) ×2 IMPLANT
TRAP FLUID SMOKE EVACUATOR (MISCELLANEOUS) ×1 IMPLANT
WATER STERILE IRR 500ML POUR (IV SOLUTION) ×1 IMPLANT
WIRE OLIVE GUIDE TOWER (WIRE) IMPLANT
WIRE OLIVE SMOOTH 1.4MMX60MM (WIRE) IMPLANT

## 2023-10-02 NOTE — Interval H&P Note (Signed)
 History and Physical Interval Note:  10/02/2023 1:51 PM  Phillip Stephenson  has presented today for surgery, with the diagnosis of TRIMALLEOLAR FRACTURE OF LEFT ANKLE.  The various methods of treatment have been discussed with the patient and family. After consideration of risks, benefits and other options for treatment, the patient has consented to  Procedure(s) with comments: OPEN REDUCTION INTERNAL FIXATION (ORIF) ANKLE FRACTURE (Left) - IV SEDATION as a surgical intervention.  The patient's history has been reviewed, patient examined, no change in status, stable for surgery.  I have reviewed the patient's chart and labs.  Questions were answered to the patient's satisfaction.     Thresa CHRISTELLA Sar

## 2023-10-02 NOTE — Transfer of Care (Signed)
 Immediate Anesthesia Transfer of Care Note  Patient: Phillip Stephenson  Procedure(s) Performed: OPEN REDUCTION INTERNAL FIXATION (ORIF) ANKLE FRACTURE (Left: Ankle)  Patient Location: PACU  Anesthesia Type:General  Level of Consciousness: drowsy  Airway & Oxygen Therapy: Patient Spontanous Breathing and Patient connected to face mask oxygen  Post-op Assessment: Report given to RN and Post -op Vital signs reviewed and stable  Post vital signs: Reviewed and stable  Last Vitals:  Vitals Value Taken Time  BP 112/81 10/02/23 18:25  Temp 97.2 10/02/23 18:25  Pulse 92 10/02/23 18:30  Resp 21 10/02/23 18:30  SpO2 100 % 10/02/23 18:30  Vitals shown include unfiled device data.  Last Pain:  Vitals:   10/02/23 1336  TempSrc: Temporal  PainSc: 0-No pain         Complications: No notable events documented.

## 2023-10-02 NOTE — Op Note (Signed)
 OPERATIVE REPORT Patient name: Phillip Stephenson MRN: [id number] DOB: [birth year]  DOS: 10/02/23  Preop Dx: Comminuted trimalleolar ankle fracture left Postop Dx: same  Procedure:  1.  Open reduction internal fixation left trimalleolar ankle fracture  Surgeon: Thresa EMERSON Sar DPM  Anesthesia: General anesthesia  Hemostasis: Thigh tourniquet inflated to a pressure of 300 mmHg after esmarch exsanguination   EBL: 100 mL Materials: Paragon 28 Fibular plate w/ respective screws.  Paragon 28 Medial malleolar hook plate w/ respective screws.  Paragon 28 4.68mm screw x 1.  Injectables: 20 mL of 0.5% Marcaine  plain Pathology: None  Condition: The patient tolerated the procedure and anesthesia well. No complications noted or reported   Justification for procedure: The patient is a 43 y.o. male who presents today for surgical correction of closed trimalleolar ankle fracture with comminution. The patient was told benefits as well as possible side effects of the surgery. The patient consented for surgical correction. The patient consent form was reviewed. All patient questions were answered. No guarantees were expressed or implied. The patient and the surgeon both signed the patient consent form with the witness present and placed in the patient's chart.   Procedure in Detail: The patient was brought to the operating room, placed in the operating table in the supine position at which time an aseptic scrub and drape were performed about the patient's respective lower extremity after anesthesia was induced as described above. Attention was then directed to the surgical area where procedure number one commenced.  Procedure #1: Open reduction internal fixation trimalleolar ankle fracture left  Attention was initially directed to the lateral aspect of the left ankle where a 10 cm linear longitudinal skin incision was planned and made overlying the distal aspect of the fibula using a #15 scalpel.   Incision was carefully carried down to the level of bone and fracture with care taken to cut clamp ligate and retract away all small neurovascular structures traversing the incision site.  The fracture was identified and reduced using bone reduction forceps.  Verified by intraoperative x-ray fluoroscopy.  Interfrag screw was placed across the fracture site followed by a fibular plate with respective screws.  All hardware was inserted in the standard AO fashion.  Hardware inserted in the standard AO fashion and verified with x-ray fluoroscopy which was satisfactory.  Attention was then directed to the medial aspect of the ankle fracture where a 5 cm linear longitudinal skin incision was planned and made overlying the medial malleolus of the tibia.  After careful dissection there was significant amount of comminution to the medial malleolus including multiple fragments not previously anticipated.  The fragments were reduced and permanent internal fixation was achieved using a medial malleolar hook plate with respective screws.  Again verified by x-ray fluoroscopy and inserted in the standard AO fashion.  After reduction and internal fixation of the medial and lateral fracture fragments, attention was then directed to the posterior malleollar fracture fragment which demonstrated additional comminution and mild displacement.  Decision was made to internally fixate the posterior fragment using a 4.0 mm orthopedic screw.  Placement of the screw was oriented slightly medial by design based on CT imaging which showed the larger posterior fragment located more along the medial portion of the distal tibia.  Again the orthopedic screw was inserted in standard AO fashion and verified with intraoperative x-ray fluoroscopy.  Given the amount of comminution and fragmentation within the ankle, overall there was good restoration of the tibiotalar joint and reduction and  stabilization of the fracture fragments.  Copious  irrigation was then utilized in preparation for routine layered primary closure.  3-0 Vicryl suture was utilized to reapproximate deep subcutaneous tissue followed by stainless steel skin staples to reapproximate superficial skin edges.  Dry sterile compressive dressings were then applied to all previously mentioned incision sites about the patient's lower extremity. The tourniquet which was used for hemostasis was deflated. All normal neurovascular responses including pink color and warmth returned all the digits of patient's lower extremity.  Overall the approximate duration of the surgery was 2.5 hours.  The tourniquet was deflated at 122 minutes and the remaining portion of the surgery was performed without tourniquet.  During the last few minutes of closure of the incision sites the tourniquet was then reinflated.  The patient was then transferred from the operating room to the recovery room having tolerated the procedure and anesthesia well. All vital signs are stable. After a brief stay in the recovery room the patient was readmitted to inpatient room with postoperative orders placed.  Strict nonweightbearing in cam boot  IMPRESSION: Significant amount of comminution and fragmentation to the fracture fragments and ankle.  Overall decent stabilization and restoration of the tibiotalar joint.  Thresa EMERSON Sar, DPM [facility]  Dr. Thresa EMERSON Sar, DPM    [address] [place], [place] [address]                Office [phone]  Fax [phone]

## 2023-10-02 NOTE — Anesthesia Preprocedure Evaluation (Addendum)
 Anesthesia Evaluation  Patient identified by MRN, date of birth, ID band Patient awake    Reviewed: Allergy & Precautions, H&P , NPO status , Patient's Chart, lab work & pertinent test results  Airway Mallampati: II  TM Distance: >3 FB Neck ROM: full    Dental no notable dental hx.    Pulmonary Current SmokerPatient did not abstain from smoking.   Pulmonary exam normal        Cardiovascular negative cardio ROS Normal cardiovascular exam     Neuro/Psych negative neurological ROS  negative psych ROS   GI/Hepatic negative GI ROS,,,(+)     substance abuse  marijuana use  Endo/Other  negative endocrine ROS    Renal/GU negative Renal ROS  negative genitourinary   Musculoskeletal   Abdominal Normal abdominal exam  (+)   Peds  Hematology negative hematology ROS (+)   Anesthesia Other Findings Past Medical History: 09/2023: Acute metabolic acidosis     Comment:  likely from alcoholic ketoacidosis. 2025: Elevated BP without diagnosis of hypertension 09/2023: Elevated liver enzymes No date: ETOH abuse 2024: STD (male) No date: Substance abuse (HCC) 09/2023: Trimalleolar fracture of ankle, closed, left, initial  encounter  Past Surgical History: No date: HERNIA REPAIR     Reproductive/Obstetrics negative OB ROS                              Anesthesia Physical Anesthesia Plan  ASA: 2  Anesthesia Plan: General   Post-op Pain Management: Tylenol  PO (pre-op)*, Celebrex PO (pre-op)*, Gabapentin PO (pre-op)* and Regional block*   Induction: Intravenous  PONV Risk Score and Plan: Propofol infusion and TIVA  Airway Management Planned: Natural Airway  Additional Equipment:   Intra-op Plan:   Post-operative Plan:   Informed Consent: I have reviewed the patients History and Physical, chart, labs and discussed the procedure including the risks, benefits and alternatives for the  proposed anesthesia with the patient or authorized representative who has indicated his/her understanding and acceptance.     Dental Advisory Given  Plan Discussed with: CRNA and Surgeon  Anesthesia Plan Comments: (LMA backup discussed)         Anesthesia Quick Evaluation

## 2023-10-02 NOTE — Anesthesia Procedure Notes (Signed)
 Procedure Name: LMA Insertion Date/Time: 10/02/2023 3:03 PM  Performed by: Dyane Mass, CRNAPre-anesthesia Checklist: Patient identified, Emergency Drugs available, Suction available and Patient being monitored Patient Re-evaluated:Patient Re-evaluated prior to induction Oxygen Delivery Method: Circle system utilized Preoxygenation: Pre-oxygenation with 100% oxygen Induction Type: IV induction LMA: LMA inserted LMA Size: 4.0 Tube type: Oral Number of attempts: 2 Placement Confirmation: positive ETCO2 and breath sounds checked- equal and bilateral Tube secured with: Tape Dental Injury: Teeth and Oropharynx as per pre-operative assessment

## 2023-10-02 NOTE — Brief Op Note (Signed)
 10/02/2023  6:18 PM  PATIENT:  Arshad D Thorpe  43 y.o. male  PRE-OPERATIVE DIAGNOSIS:  TRIMALLEOLAR FRACTURE OF LEFT ANKLE  POST-OPERATIVE DIAGNOSIS:  TRIMALLEOLAR FRACTURE OF LEFT ANKLE  PROCEDURE:  Procedure(s): OPEN REDUCTION INTERNAL FIXATION (ORIF) ANKLE FRACTURE (Left)  SURGEON:  Surgeons and Role:    DEWAINE Janit Thresa CHRISTELLA, DPM - Primary  PHYSICIAN ASSISTANT: None   ASSISTANTS: none   ANESTHESIA:   general  EBL:    BLOOD ADMINISTERED:none  DRAINS: none   LOCAL MEDICATIONS USED:  MARCAINE    and Amount: 25 ml  SPECIMEN:  No Specimen  DISPOSITION OF SPECIMEN:  N/A  COUNTS:  YES  TOURNIQUET:   Total Tourniquet Time Documented: Thigh (Left) - 122 minutes Thigh (Left) - 17 minutes Total: Thigh (Left) - 139 minutes   DICTATION: .[place] Dictation  PLAN OF CARE: Discharge to home after PACU  PATIENT DISPOSITION:  PACU - hemodynamically stable.   Delay start of Pharmacological VTE agent (>24hrs) due to surgical blood loss or risk of bleeding: not applicable

## 2023-10-02 NOTE — Discharge Instructions (Signed)
 Leave dressings clean, dry, and intact. Non-weight bearing in the CAM walker with crutches. Elevate surgical foot above heart w/ pillows. Ice behind knee 32min/hour for the first 24-48 hours.

## 2023-10-03 NOTE — Anesthesia Postprocedure Evaluation (Signed)
 Anesthesia Post Note  Patient: Phillip Stephenson  Procedure(s) Performed: OPEN REDUCTION INTERNAL FIXATION (ORIF) ANKLE FRACTURE (Left: Ankle)  Patient location during evaluation: PACU Anesthesia Type: General Level of consciousness: awake and alert Pain management: pain level controlled Vital Signs Assessment: post-procedure vital signs reviewed and stable Respiratory status: spontaneous breathing, nonlabored ventilation and respiratory function stable Cardiovascular status: blood pressure returned to baseline and stable Postop Assessment: no apparent nausea or vomiting Anesthetic complications: no   No notable events documented.   Last Vitals:  Vitals:   10/02/23 1916 10/02/23 1925  BP: (!) 134/97 (!) 139/99  Pulse: 100 96  Resp: 15 16  Temp: 37 C   SpO2: 98% 97%    Last Pain:  Vitals:   10/02/23 1925  TempSrc:   PainSc: 4                  Camellia Merilee Louder

## 2023-10-05 ENCOUNTER — Encounter: Payer: Self-pay | Admitting: Podiatry

## 2023-10-05 ENCOUNTER — Telehealth: Payer: Self-pay | Admitting: Podiatry

## 2023-10-05 NOTE — Telephone Encounter (Signed)
 There is an ace bandage on the patients thigh. He can't remember what you told him after surgery.Does this  bandage need to stay or can it be removed ? Follow up appointment is Friday 8, 2025.

## 2023-10-09 ENCOUNTER — Ambulatory Visit: Admitting: Podiatry

## 2023-10-09 ENCOUNTER — Encounter: Payer: Self-pay | Admitting: Podiatry

## 2023-10-09 ENCOUNTER — Ambulatory Visit (INDEPENDENT_AMBULATORY_CARE_PROVIDER_SITE_OTHER)

## 2023-10-09 VITALS — Ht 67.0 in | Wt 170.0 lb

## 2023-10-09 DIAGNOSIS — S82852A Displaced trimalleolar fracture of left lower leg, initial encounter for closed fracture: Secondary | ICD-10-CM | POA: Diagnosis not present

## 2023-10-09 MED ORDER — OXYCODONE-ACETAMINOPHEN 5-325 MG PO TABS: 1.0000 | ORAL_TABLET | ORAL | 0 refills | Status: DC | PRN

## 2023-10-09 MED ORDER — IBUPROFEN 800 MG PO TABS: 800.0000 mg | ORAL_TABLET | Freq: Three times a day (TID) | ORAL | 1 refills | Status: DC | PRN

## 2023-10-09 NOTE — Progress Notes (Signed)
   Chief Complaint  Patient presents with   Routine Post Op    POV # 1 DOS 10/02/23 LEFT OPEN REDUCTION W INTERNAL FIXATION ANKLE FRACTURE, pt states the first few days were rough put feels a lot better now still some pain but he can deal with it.    Subjective:  Patient presents today status post ORIF comminuted left ankle fracture.  DOS: 10/02/2023.  [facility] hospital.  Patient doing well.  Dressings are clean dry and intact and he has been nonweightbearing in the cam boot  Past Medical History:  Diagnosis Date   Acute metabolic acidosis 09/2023   likely from alcoholic ketoacidosis.   Elevated BP without diagnosis of hypertension 2025   Elevated liver enzymes 09/2023   ETOH abuse    STD (male) 2024   Substance abuse (HCC)    Trimalleolar fracture of ankle, closed, left, initial encounter 09/2023    Past Surgical History:  Procedure Laterality Date   HERNIA REPAIR     ORIF ANKLE FRACTURE Left 10/02/2023   Procedure: OPEN REDUCTION INTERNAL FIXATION (ORIF) ANKLE FRACTURE;  Surgeon: Janit Thresa HERO, DPM;  Location: [facility] ORS;  Service: Orthopedics/Podiatry;  Laterality: Left;    No Known Allergies  Objective/Physical Exam Neurovascular status intact.  Incision well coapted with staples intact. No sign of infectious process noted. No dehiscence. No active bleeding noted.  Moderate edema noted to the surgical extremity.  Radiographic Exam LT ankle 10/09/2023:  Orthopedic hardware is intact.  Stable.   tibiotalar joint congruent  Assessment: 1. s/p ORIF LT ankle. DOS: 10/02/2023   Plan of Care:  -Patient was evaluated. X-rays reviewed - Dressings changed.  Leave clean dry and intact -Continue NWB CAM boot with crutches -Refill prescription for Percocet 5/325 mg every 4 hours as needed pain -Refill prescription for Motrin  800 mg TID -Return to clinic 1 week   Thresa EMERSON Janit, DPM [facility]  Dr. Thresa EMERSON Janit, DPM    [address] [place], [place] [address]                Office [phone]  Fax [phone]

## 2023-10-16 ENCOUNTER — Encounter: Payer: Self-pay | Admitting: Podiatry

## 2023-10-16 ENCOUNTER — Ambulatory Visit (INDEPENDENT_AMBULATORY_CARE_PROVIDER_SITE_OTHER): Admitting: Podiatry

## 2023-10-16 VITALS — Ht 67.0 in | Wt 170.0 lb

## 2023-10-16 DIAGNOSIS — S82852A Displaced trimalleolar fracture of left lower leg, initial encounter for closed fracture: Secondary | ICD-10-CM

## 2023-10-16 NOTE — Progress Notes (Signed)
   No chief complaint on file.   Subjective:  Patient presents today status post ORIF comminuted left ankle fracture.  DOS: 10/02/2023.  [facility] hospital.  Continues to do well.  NWB in the cam boot.  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   Elevated BP without diagnosis of hypertension 2025   Elevated liver enzymes 09/2023   ETOH abuse    STD (male) 2024   Substance abuse (HCC)    Trimalleolar fracture of ankle, closed, left, initial encounter 09/2023    Past Surgical History:  Procedure Laterality Date   HERNIA REPAIR     ORIF ANKLE FRACTURE Left 10/02/2023   Procedure: OPEN REDUCTION INTERNAL FIXATION (ORIF) ANKLE FRACTURE;  Surgeon: Janit Thresa HERO, DPM;  Location: [facility] ORS;  Service: Orthopedics/Podiatry;  Laterality: Left;    No Known Allergies  Objective/Physical Exam Continued improvement noted to the ankle.  Neurovascular status intact.  Incision well coapted with staples intact. No sign of infectious process noted. No dehiscence. No active bleeding noted.  Improved edema noted to the surgical extremity.  Radiographic Exam LT ankle 10/09/2023:  Orthopedic hardware is intact.  Stable.   tibiotalar joint congruent  Assessment: 1. s/p ORIF LT ankle. DOS: 10/02/2023   Plan of Care:  -Patient was evaluated. X-rays reviewed - Dressings changed.  Leave clean dry and intact -Continue NWB CAM boot with crutches - Continue Percocet 5/325 mg every 4 hours as needed pain - Continue Motrin  800 mg TID -Return to clinic 1 week staple removal   Thresa EMERSON Janit, DPM [facility]  Dr. Thresa EMERSON Janit, DPM    [address] [place], [place] [address]                Office [phone]  Fax [phone]

## 2023-10-23 ENCOUNTER — Ambulatory Visit (INDEPENDENT_AMBULATORY_CARE_PROVIDER_SITE_OTHER): Admitting: Podiatry

## 2023-10-23 ENCOUNTER — Encounter: Payer: Self-pay | Admitting: Podiatry

## 2023-10-23 VITALS — Ht 67.0 in | Wt 170.0 lb

## 2023-10-23 DIAGNOSIS — S82852A Displaced trimalleolar fracture of left lower leg, initial encounter for closed fracture: Secondary | ICD-10-CM

## 2023-10-23 MED ORDER — IBUPROFEN 800 MG PO TABS: 800.0000 mg | ORAL_TABLET | Freq: Three times a day (TID) | ORAL | 1 refills | Status: DC | PRN

## 2023-10-23 MED ORDER — OXYCODONE-ACETAMINOPHEN 5-325 MG PO TABS: 1.0000 | ORAL_TABLET | Freq: Three times a day (TID) | ORAL | 0 refills | Status: DC | PRN

## 2023-10-26 ENCOUNTER — Telehealth: Payer: Self-pay

## 2023-10-26 NOTE — Telephone Encounter (Signed)
 PA was approved 10/26/23-11/02/2023

## 2023-10-26 NOTE — Telephone Encounter (Signed)
 PA request received from CoverMyMeds for Oxycodone -Acetaminophen  5/325mg  tablets. PA was submitted and waiting on response.  Melford Tullier  (Key: [id number]) Rx #: [phone]

## 2023-10-30 ENCOUNTER — Encounter: Admitting: Podiatry

## 2023-11-04 NOTE — Progress Notes (Signed)
   Chief Complaint  Patient presents with   Routine Post Op    POV # 3 DOS 10/02/23 LEFT OPEN REDUCTION W INTERNAL FIXATION ANKLE FRACTURE, pt states everything is going well has a lot of discomfort due to staples and a lot of itching no other complaints.     Subjective:  Patient presents today status post ORIF comminuted left ankle fracture.  DOS: 10/02/2023.  [facility] hospital.  Continues to do well.  NWB in the cam boot.  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   Elevated BP without diagnosis of hypertension 2025   Elevated liver enzymes 09/2023   ETOH abuse    STD (male) 2024   Substance abuse (HCC)    Trimalleolar fracture of ankle, closed, left, initial encounter 09/2023    Past Surgical History:  Procedure Laterality Date   HERNIA REPAIR     ORIF ANKLE FRACTURE Left 10/02/2023   Procedure: OPEN REDUCTION INTERNAL FIXATION (ORIF) ANKLE FRACTURE;  Surgeon: Janit Thresa HERO, DPM;  Location: [facility] ORS;  Service: Orthopedics/Podiatry;  Laterality: Left;    No Known Allergies  Objective/Physical Exam Unchanged.  Continued improvement noted to the ankle.  Neurovascular status intact.  Incision well coapted with staples intact. No sign of infectious process noted. No dehiscence. No active bleeding noted.  Improved edema noted to the surgical extremity.  Radiographic Exam LT ankle 10/09/2023:  Orthopedic hardware is intact.  Stable.   tibiotalar joint congruent  Assessment: 1. s/p ORIF LT ankle. DOS: 10/02/2023   Plan of Care:  -Patient was evaluated. -Staples removed -Continue NWB CAM boot with crutches -Continue Percocet 5/325 mg every 4 hours as needed pain - Continue Motrin  800 mg TID -Return to clinic 2 weeks follow-up x-ray LT ankle   Thresa EMERSON Janit, DPM [facility]  Dr. Thresa EMERSON Janit, DPM    [address] [place], [place] [address]                 Office [phone]  Fax [phone]

## 2023-11-17 ENCOUNTER — Ambulatory Visit (INDEPENDENT_AMBULATORY_CARE_PROVIDER_SITE_OTHER)

## 2023-11-17 ENCOUNTER — Ambulatory Visit (INDEPENDENT_AMBULATORY_CARE_PROVIDER_SITE_OTHER): Admitting: Podiatry

## 2023-11-17 DIAGNOSIS — S82852D Displaced trimalleolar fracture of left lower leg, subsequent encounter for closed fracture with routine healing: Secondary | ICD-10-CM

## 2023-11-17 MED ORDER — IBUPROFEN 800 MG PO TABS: 800.0000 mg | ORAL_TABLET | Freq: Three times a day (TID) | ORAL | 1 refills | Status: DC | PRN

## 2023-11-17 NOTE — Progress Notes (Signed)
   Chief Complaint  Patient presents with   Fracture    4 week follow up fracture left ankle    Subjective:  Patient presents today status post ORIF comminuted left ankle fracture.  DOS: 10/02/2023.  [facility] hospital.  Continues to do well.  NWB in the cam boot.  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   Elevated BP without diagnosis of hypertension 2025   Elevated liver enzymes 09/2023   ETOH abuse    STD (male) 2024   Substance abuse (HCC)    Trimalleolar fracture of ankle, closed, left, initial encounter 09/2023    Past Surgical History:  Procedure Laterality Date   HERNIA REPAIR     ORIF ANKLE FRACTURE Left 10/02/2023   Procedure: OPEN REDUCTION INTERNAL FIXATION (ORIF) ANKLE FRACTURE;  Surgeon: Stephenson Phillip HERO, DPM;  Location: [facility] ORS;  Service: Orthopedics/Podiatry;  Laterality: Left;    No Known Allergies  Objective/Physical Exam Unchanged.  Continued improvement noted to the ankle.  Neurovascular status intact.  Incisions healed.  Moderate chronic edema noted to the ankle.  Radiographic Exam LT ankle 11/17/2023:  Unchanged from prior x-rays.  Orthopedic hardware is intact.  Stable.   tibiotalar joint congruent  Assessment: 1. s/p ORIF LT ankle. DOS: 10/02/2023   Plan of Care:  -Patient was evaluated. - Okay to begin [facility] in the cam boot with the assistance of crutches.  Slowly transition to FWB over 2 weeks.  After that he may discontinue the crutches -Refill prescription for Motrin  800 mg 3 times daily as needed -Recommend passive range of motion exercises which were demonstrated today to the ankle when nonweightbearing -Return to clinic 4 weeks follow-up x-ray and to begin possible physical therapy  Phillip Stephenson, DPM [facility]  Dr. Thresa EMERSON Stephenson, DPM    [address] [place], [place] [address]                Office [phone]  Fax [phone]

## 2023-12-04 ENCOUNTER — Encounter: Admitting: Podiatry

## 2023-12-08 ENCOUNTER — Ambulatory Visit (INDEPENDENT_AMBULATORY_CARE_PROVIDER_SITE_OTHER): Admitting: Podiatry

## 2023-12-08 ENCOUNTER — Encounter: Payer: Self-pay | Admitting: Podiatry

## 2023-12-08 ENCOUNTER — Ambulatory Visit (INDEPENDENT_AMBULATORY_CARE_PROVIDER_SITE_OTHER)

## 2023-12-08 VITALS — Ht 67.0 in | Wt 170.0 lb

## 2023-12-08 DIAGNOSIS — S82852D Displaced trimalleolar fracture of left lower leg, subsequent encounter for closed fracture with routine healing: Secondary | ICD-10-CM | POA: Diagnosis not present

## 2023-12-08 NOTE — Progress Notes (Signed)
   Chief Complaint  Patient presents with   Routine Post Op    POV4 #  DOS 10/02/23 LEFT OPEN REDUCTION W INTERNAL FIXATION ANKLE FRACTURE, pt states everything is going well, walking without crutches, states occasional pain but not as bad as before.    Subjective:  Patient presents today status post ORIF comminuted left ankle fracture.  DOS: 10/02/2023.  [facility] hospital.  Continues to do well.  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   Elevated BP without diagnosis of hypertension 2025   Elevated liver enzymes 09/2023   ETOH abuse    STD (male) 2024   Substance abuse (HCC)    Trimalleolar fracture of ankle, closed, left, initial encounter 09/2023    Past Surgical History:  Procedure Laterality Date   HERNIA REPAIR     ORIF ANKLE FRACTURE Left 10/02/2023   Procedure: OPEN REDUCTION INTERNAL FIXATION (ORIF) ANKLE FRACTURE;  Surgeon: Janit Thresa HERO, DPM;  Location: [facility] ORS;  Service: Orthopedics/Podiatry;  Laterality: Left;    No Known Allergies  Objective/Physical Exam Unchanged.  Continued improvement noted to the ankle.  Neurovascular status intact.  Incisions healed.  Moderate chronic edema noted to the ankle.  Radiographic Exam LT ankle 12/08/2023:  Stable with routine healing.  Orthopedic hardware is intact.  Stable.   tibiotalar joint congruent  Assessment: 1. s/p ORIF LT ankle. DOS: 10/02/2023   Plan of Care:  -Patient was evaluated. - Continue Motrin  800 mg 3 times daily as needed -Okay to transition out of cam boot to good supportive tennis shoes and sneakers - Order placed for physical therapy at [facility]  -Return to clinic 8 weeks  Thresa EMERSON Janit, DPM [facility]  Dr. Thresa EMERSON Janit, DPM    [address] [place], [place] [address]                Office [phone]  Fax [phone]

## 2023-12-16 ENCOUNTER — Ambulatory Visit: Attending: Podiatry | Admitting: Physical Therapy

## 2023-12-16 ENCOUNTER — Encounter: Payer: Self-pay | Admitting: Physical Therapy

## 2023-12-16 DIAGNOSIS — R262 Difficulty in walking, not elsewhere classified: Secondary | ICD-10-CM | POA: Insufficient documentation

## 2023-12-16 DIAGNOSIS — S82852D Displaced trimalleolar fracture of left lower leg, subsequent encounter for closed fracture with routine healing: Secondary | ICD-10-CM | POA: Insufficient documentation

## 2023-12-16 DIAGNOSIS — M25572 Pain in left ankle and joints of left foot: Secondary | ICD-10-CM | POA: Insufficient documentation

## 2023-12-16 DIAGNOSIS — M25675 Stiffness of left foot, not elsewhere classified: Secondary | ICD-10-CM | POA: Insufficient documentation

## 2023-12-16 DIAGNOSIS — M25672 Stiffness of left ankle, not elsewhere classified: Secondary | ICD-10-CM | POA: Insufficient documentation

## 2023-12-16 DIAGNOSIS — M6281 Muscle weakness (generalized): Secondary | ICD-10-CM | POA: Diagnosis present

## 2023-12-16 NOTE — Therapy (Signed)
 OUTPATIENT PHYSICAL THERAPY EVALUATION   Patient Name: Phillip Stephenson MRN: [id number] DOB:[birth year], 43 y.o., male Today's Date: 12/16/2023  END OF SESSION:  PT End of Session - 12/16/23 1222     Visit Number 1    Number of Visits 17    Date for Recertification  03/09/24    Authorization Type [place] MEDICAID HEALTHY BLUE reporting period from 12/16/23    Authorization - Visit Number 1    Authorization - Number of Visits 1    Progress Note Due on Visit 10    PT Start Time 1034    PT Stop Time 1114    PT Time Calculation (min) 40 min    Activity Tolerance Patient tolerated treatment well;Patient limited by pain    Behavior During Therapy [facility] 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   Elevated BP without diagnosis of hypertension 2025   Elevated liver enzymes 09/2023   ETOH abuse    STD (male) 2024   Substance abuse (HCC)    Trimalleolar fracture of ankle, closed, left, initial encounter 09/2023   Past Surgical History:  Procedure Laterality Date   HERNIA REPAIR     ORIF ANKLE FRACTURE Left 10/02/2023   Procedure: OPEN REDUCTION INTERNAL FIXATION (ORIF) ANKLE FRACTURE;  Surgeon: Janit Thresa HERO, DPM;  Location: [facility] ORS;  Service: Orthopedics/Podiatry;  Laterality: Left;   There are no active problems to display for this patient.   PCP: no PCP per patient  REFERRING PROVIDER: Janit Thresa HERO, DPM  REFERRING DIAG: trimalleolar fracture of ankle, closed, left, with routine healing  THERAPY DIAG:  Pain in left ankle and joints of left foot  Stiffness of left ankle, not elsewhere classified  Stiffness of left foot, not elsewhere classified  Muscle weakness (generalized)  Difficulty in walking, not elsewhere classified  Rationale for Evaluation and Treatment: Rehabilitation  ONSET DATE: fracture 09/19/2023, ORIF 10/02/2023  SUBJECTIVE:   SUBJECTIVE  STATEMENT: Pt is here for PT evaluation s/p L ankle ORIF on 10/02/2023 due to comminuted trimalleolar fracture. He has been been wearing shoes since he last saw his doctor. He bought basketball shoes because they are high tops and give more support. He had a compression wrap and stopped using it because it is uncomfortable. .   What are your expectations for today? To know what to do to make sure I get better. Manner of onset (traumatic, sudden, insidious): traumatic, playing basket ball (no rush to get back to it) Related signs and symptoms: denies numbness, tingling Previous episodes: broke R hip before as a kid but nothing in the L ankle Occupation: Tree work (part time flexible schedule, groundwork) Recreational Activities and hobbies: working out ([employer] and treadmill), basketball Functional limitations: difficulty with working, working out, playing with son, walking, standing, running, sleeping, being up for more than 2-3 hours, wearing shoes for more than 2-3 hours.   He has back pain on and off. It did go down his right leg to his hip once, but not on the left.     PERTINENT HISTORY: Patient is a 43 y.o. male who presents to outpatient physical therapy with a referral for medical diagnosis trimalleolar fracture of ankle, closed, left, with routine healing. This patient's chief complaints consist of left ankle pain, stiffness, swelling, and weakness s/p L ankle ORIF 8/1/205 2/2 comminuted L ankle fracture after coming down wrong  while playing basketball on 09/19/23, leading to the following functional deficits:  difficulty with working, working out, playing with son, walking, standing, running, sleeping, being up for more than 2-3 hours, wearing shoes for more than 2-3 hours. Relevant past medical history and comorbidities include the following: he  has a past medical history of Acute metabolic acidosis (09/2023), Elevated BP without diagnosis of hypertension (2025), Elevated liver  enzymes (09/2023), ETOH abuse, STD (male) (2024), Substance abuse (HCC), and Trimalleolar fracture of ankle, closed, left, initial encounter (09/2023). he  has a past surgical history that includes Hernia repair and ORIF ankle fracture (Left, 10/02/2023). Patient denies hx of cancer, stroke, seizures, lung problems, heart problems, diabetes, unexplained weight loss, unexplained changes in bowel or bladder problems, unexplained stumbling or dropping things, osteoporosis, and spinal surgery  Exercise history: works out (lifting and treadmill)   PAIN: Are you having pain? Yes NPRS: Current: 3-4/10,  Best: 2/10, Worst: 6/10. Pain location: medial and lateral Left ankle, a little over the top of the left foot Pain description: achy, sharp pains here and there. Numbness/tingling: denies Aggravating factors: doing too much for too long, going too much of a distance Relieving factors: laying down and elevating, ibuprofen    PRECAUTIONS: None  WEIGHT BEARING RESTRICTIONS: No  FALLS:  Has patient fallen in last 6 months? Yes. Number of falls when he broke his ankle while playing ball   PLOF: no limitations due to L ankle  PATIENT GOALS: To get back walking normal without a limp   OBJECTIVE  DIAGNOSTIC FINDINGS:  Radiographic Exam LT ankle 12/08/2023 (from Dr. Ernestine note 12/08/23):  Stable with routine healing.  Orthopedic hardware is intact.  Stable.   tibiotalar joint congruent  SELF-REPORTED FUNCTION  Lower Extremity Functional Scale Extreme difficulty/unable (0), Quite a bit of difficulty (1), Moderate difficulty (2), Little difficulty (3), No difficulty (4) Survey date:  12/16/23  Any of your usual work, housework or school activities 3  2. Usual hobbies, recreational or sporting activities 0  3. Getting into/out of the bath 3  4. Walking between rooms 4  5. Putting on socks/shoes 4  6. Squatting  4  7. Lifting an object, like a bag of groceries from the floor 4  8. Performing light  activities around your home 4  9. Performing heavy activities around your home 2  10. Getting into/out of a car 4  11. Walking 2 blocks 3  12. Walking 1 mile 1  13. Going up/down 10 stairs (1 flight) 4  14. Standing for 1 hour 4  15.  sitting for 1 hour 4  16. Running on even ground 2  17. Running on uneven ground 2  18. Making sharp turns while running fast 2  19. Hopping  3  20. Rolling over in bed 4  Score total:  61/80     TESTS OF FUNCTION Weight Bearing  Standing (observe weight bearing distribution): toes out, L foot slightly forwards.  Walking: Ambulation without shoes: mildly antalgic favoring L LE, pain anterior ankle with load acceptance, limited R step length, heel strike but minimal toe off. Short careful steps.  Heel / toe raise: unable to single leg heel raise Squat: air squat, limited tibial translation, compensates by widening stance and excessive toe out.   PERIPHERAL JOINT MOTION (in degrees)  ACTIVE RANGE OF MOTION (AROM) *Indicates pain 12/16/23 Date Date  Joint/Motion R/L R/L R/L  Ankle/Foot     Plantarflexion 31/25 / /  Everison 30/18* / /  Inversion  30/10 / /  Comments:   PASSIVE RANGE OF MOTION (PROM) *Indicates pain 12/16/23 Date Date  Joint/Motion R/L R/L R/L  Ankle/Foot     Dorsiflexion (knee ext, CKC) 20/8* / /  Dorsiflexion (knee flex, CKC) 27/10* / /  Great toe extension 70/50* / /  Comments:   MUSCLE PERFORMANCE (MMT):  *Indicates pain 12/16/23 Date Date  Joint/Motion R/L R/L R/L  Ankle/Foot     Dorsiflexion (L4) 5/4* / /  Great toe extension (L5) 4+/4* / /  Eversion (S1) 4/3+* / /  Plantarflexion (S1) 5/4* / /  Inversion 5/3+* / /  Pronation / / /  Great toe flexion / / /                                                                                                                                TREATMENT Therapeutic exercise: therapeutic exercises that incorporate ONE parameter at one or more areas of the body to centralize  symptoms, develop strength and endurance, range of motion, and flexibility required for successful completion of functional activities. Long sitting and seated L ankle DF stretch with knee extended using towel Several reps of 10 second holds while learning how to perform and during HEP building.   Seated L ankle DF stretch with knee flexed using towel Several reps of 10 second holds while learning how to perform and during HEP building.   Seated L ankle/toes PF stretch with top of foot on floor Several reps of 10 second holds while learning how to perform and during HEP building.  Education on diagnosis, prognosis, POC, anatomy and physiology of current condition.   Education on HEP including handout   Pt required multimodal cuing for proper technique and to facilitate improved neuromuscular control, strength, range of motion, and functional ability resulting in improved performance and form.    PATIENT EDUCATION:  Education details: Exercise purpose/form. Self management techniques. Education on diagnosis, prognosis, POC, anatomy and physiology of current condition. Education on HEP including handout. Person educated: Patient Education method: Explanation, Demonstration, Verbal cues, and Handouts Education comprehension: verbalized understanding, returned demonstration, and needs further education  HOME EXERCISE PROGRAM: Access Code: [email or web address] URL: [email or web address] Date: 12/16/2023 Prepared by: Camie Cleverly  Exercises - Leg stretch  - 1-2 x daily - 5-10 reps - 10 seconds hold - Seated Soleus Stretch with Strap  - 1-2 x daily - 5-10 reps - 10 seconds hold - Seated Anterior Tibialis Stretch  - 1-2 x daily - 5-10 reps - 10 seconds hold  ASSESSMENT:  CLINICAL IMPRESSION: Patient is a 43 y.o. male referred to outpatient physical therapy with a medical diagnosis of trimalleolar fracture of ankle, closed, left, with routine healing, who presents with signs and  symptoms consistent with left ankle stiffness, pain, swelling, and weakness following ORIF on 10/02/23. Patient presents with significant pain, ROM, joint stiffness, edema, motor control, knowledge, muscle performance (Strength/power/endurance), balance, proprioception, and activity tolerance impairments that are  limiting ability to complete usual activities such as working, working out, Scientist, physiological, playing with son, walking, standing, running, sleeping, being up for more than 2-3 hours, wearing shoes for more than 2-3 hours without difficulty. Patient will benefit from skilled physical therapy intervention to address current body structure impairments and activity limitations to improve function and work towards goals set in current POC in order to return to prior level of function or maximal functional improvement.    OBJECTIVE IMPAIRMENTS: Abnormal gait, decreased activity tolerance, decreased balance, decreased endurance, decreased knowledge of condition, decreased mobility, difficulty walking, decreased ROM, decreased strength, hypomobility, increased edema, impaired flexibility, improper body mechanics, and pain.   ACTIVITY LIMITATIONS: carrying, standing, squatting, sleeping, stairs, transfers, bed mobility, and locomotion level  PARTICIPATION LIMITATIONS: interpersonal relationship, shopping, community activity, occupation, yard work, and   difficulty with working, playing basketball, working out, playing with son, walking, standing, running, sleeping, being up for more than 2-3 hours, wearing shoes for more than 2-3 hours  PERSONAL FACTORS: Past/current experiences, Time since onset of injury/illness/exacerbation, and 3+ comorbidities:  as a past medical history of Acute metabolic acidosis (09/2023), Elevated BP without diagnosis of hypertension (2025), Elevated liver enzymes (09/2023), ETOH abuse, STD (male) (2024), Substance abuse (HCC), and Trimalleolar fracture of ankle, closed, left,  initial encounter (09/2023). he  has a past surgical history that includes Hernia repair and ORIF ankle fracture (Left, 10/02/2023) are also affecting patient's functional outcome.   REHAB POTENTIAL: Good  CLINICAL DECISION MAKING: Stable/uncomplicated  EVALUATION COMPLEXITY: Low   GOALS: Goals reviewed with patient? No  SHORT TERM GOALS: Target date: 12/30/2023  Patient will be independent with initial home exercise program for self-management of symptoms. Baseline: Initial HEP provided at IE (12/16/23); Goal status: INITIAL   LONG TERM GOALS: Target date: 03/09/2024  Patient will be independent with a long-term home exercise program for self-management of symptoms.  Baseline: Initial HEP provided at IE (12/16/23); Goal status: INITIAL  2.  Patient will demonstrate improved Lower Extremity Functional Scale (LEFS) to equal or greater than 64/80 to demonstrate improvement in overall condition and self-reported functional ability.  Baseline: 61/80 (12/16/23); Goal status: INITIAL  3.  Patient will demonstrate L ankle and great toe PROM equal to R or at least at normal without increase in pain beyond intermittant end range discomfort to improve his ability to walk, run, and work.  Baseline: limited and painful - see objective  (12/16/23); Goal status: INITIAL  4.  Patient will demonstrate the ability to complete 10 single leg heel raises on the L LE to demonstrate improved strength for walking, working, and running.  Baseline: unable to get one rep (12/16/23); Goal status: INITIAL  5.  Patient will demonstrate improvement in Patient Specific Functional Scale (PSFS) of equal or greater than 8/10 points to reflect clinically significant improvement in patient's most valued functional activities. Baseline: to be measured at visit 2 as appropriate (12/16/23); Goal status: INITIAL  6.  Patient will report NPRS equal or less than 3/10 during functional activities during the last 2 weeks  to improve their abilitly to complete community, work and/or recreational activities with less limitation. Baseline: 6/10 (12/16/23); Goal status: INITIAL    PLAN:  PT FREQUENCY: 2x/week  PT DURATION: 8-12 weeks  PLANNED INTERVENTIONS: 97164- PT Re-evaluation, 97750- Physical Performance Testing, 97110-Therapeutic exercises, 97530- Therapeutic activity, [id number]- Neuromuscular re-education, 97535- Self Care, 02859- Manual therapy, [id number]- Gait training, [phone] (1-2 muscles), 20561 (3+ muscles)- Dry Needling, Patient/Family education, Balance training, Cryotherapy, and Moist heat  PLAN FOR NEXT SESSION: update HEP as appropriate, regain ROM, retraing motor control and proprioception,  improve strength, and resilience needed for  performing desired functional performance with appropriate ROM, strength, power, and endurance. Manual therapy as needed.    Camie SAUNDERS. Juli, PT, DPT, Cert. MDT 12/16/23, 9:22 PM  [facility] [address] [place], [place] [address] P: [phone] I F: [phone]

## 2023-12-21 ENCOUNTER — Ambulatory Visit: Admitting: Physical Therapy

## 2023-12-21 ENCOUNTER — Other Ambulatory Visit: Payer: Self-pay

## 2023-12-21 ENCOUNTER — Emergency Department
Admission: EM | Admit: 2023-12-21 | Discharge: 2023-12-21 | Disposition: A | Discharge status: Home / Self Care | Attending: Emergency Medicine | Admitting: Emergency Medicine

## 2023-12-21 DIAGNOSIS — E876 Hypokalemia: Secondary | ICD-10-CM | POA: Insufficient documentation

## 2023-12-21 DIAGNOSIS — R112 Nausea with vomiting, unspecified: Secondary | ICD-10-CM | POA: Insufficient documentation

## 2023-12-21 LAB — CBC WITH DIFFERENTIAL/PLATELET
Abs Immature Granulocytes: 0.02 K/uL (ref 0.00–0.07)
Basophils Absolute: 0 K/uL (ref 0.0–0.1)
Basophils Relative: 0 %
Eosinophils Absolute: 0 K/uL (ref 0.0–0.5)
Eosinophils Relative: 0 %
HCT: 51.9 % (ref 39.0–52.0)
Hemoglobin: 17.4 g/dL — ABNORMAL HIGH (ref 13.0–17.0)
Immature Granulocytes: 0 %
Lymphocytes Relative: 26 %
Lymphs Abs: 2.7 K/uL (ref 0.7–4.0)
MCH: 28.8 pg (ref 26.0–34.0)
MCHC: 33.5 g/dL (ref 30.0–36.0)
MCV: 85.9 fL (ref 80.0–100.0)
Monocytes Absolute: 0.8 K/uL (ref 0.1–1.0)
Monocytes Relative: 8 %
Neutro Abs: 7 K/uL (ref 1.7–7.7)
Neutrophils Relative %: 66 %
Platelets: 541 K/uL — ABNORMAL HIGH (ref 150–400)
RBC: 6.04 MIL/uL — ABNORMAL HIGH (ref 4.22–5.81)
RDW: 12.2 % (ref 11.5–15.5)
WBC: 10.5 K/uL (ref 4.0–10.5)
nRBC: 0 % (ref 0.0–0.2)

## 2023-12-21 LAB — COMPREHENSIVE METABOLIC PANEL WITH GFR
ALT: 27 U/L (ref 0–44)
AST: 41 U/L (ref 15–41)
Albumin: 4.7 g/dL (ref 3.5–5.0)
Alkaline Phosphatase: 113 U/L (ref 38–126)
Anion gap: 17 — ABNORMAL HIGH (ref 5–15)
BUN: 21 mg/dL — ABNORMAL HIGH (ref 6–20)
CO2: 26 mmol/L (ref 22–32)
Calcium: 9.9 mg/dL (ref 8.9–10.3)
Chloride: 94 mmol/L — ABNORMAL LOW (ref 98–111)
Creatinine, Ser: 1.2 mg/dL (ref 0.61–1.24)
GFR, Estimated: >60 mL/min (ref >60)
Glucose, Bld: 105 mg/dL — ABNORMAL HIGH (ref 70–99)
Potassium: 3.3 mmol/L — ABNORMAL LOW (ref 3.5–5.1)
Sodium: 137 mmol/L (ref 135–145)
Total Bilirubin: 2.2 mg/dL — ABNORMAL HIGH (ref 0.0–1.2)
Total Protein: 8.4 g/dL — ABNORMAL HIGH (ref 6.5–8.1)

## 2023-12-21 LAB — LIPASE, BLOOD: Lipase: 32 U/L (ref 11–51)

## 2023-12-21 MED ORDER — ONDANSETRON HCL 4 MG/2ML IJ SOLN
4.0000 mg | Freq: Once | INTRAMUSCULAR | Status: AC
  Administered 2023-12-21: 4 mg via INTRAVENOUS
  Filled 2023-12-21: qty 2

## 2023-12-21 MED ORDER — POTASSIUM CHLORIDE CRYS ER 20 MEQ PO TBCR
40.0000 meq | EXTENDED_RELEASE_TABLET | Freq: Once | ORAL | Status: AC
  Administered 2023-12-21: 40 meq via ORAL
  Filled 2023-12-21: qty 2

## 2023-12-21 MED ORDER — ONDANSETRON 4 MG PO TBDP: 4.0000 mg | ORAL_TABLET | Freq: Three times a day (TID) | ORAL | 0 refills | Status: AC | PRN

## 2023-12-21 MED ORDER — SODIUM CHLORIDE 0.9 % IV BOLUS
1000.0000 mL | Freq: Once | INTRAVENOUS | Status: AC
  Administered 2023-12-21: 1000 mL via INTRAVENOUS

## 2023-12-21 NOTE — ED Notes (Signed)
 Pt given water and crackers for PO challenge

## 2023-12-21 NOTE — ED Provider Notes (Signed)
 [facility] Provider Note    Event Date/Time   First MD Initiated Contact with Patient 12/21/23 [phone]     (approximate)   History   Emesis   HPI  Phillip Stephenson is a 43 y.o. male  who presents to the emergency department today because of concern for nausea and vomiting. Symptoms started two days ago. Has been fairly constant. He says he has not been able to tolerate PO. Did drink the night before the symptoms started. Denies any significant pain. Denies any diarrhea or fevers.     Physical Exam   Triage Vital Signs: ED Triage Vitals  Encounter Vitals Group     BP 12/21/23 0838 (!) 151/110     Girls Systolic BP Percentile --      Girls Diastolic BP Percentile --      Boys Systolic BP Percentile --      Boys Diastolic BP Percentile --      Pulse Rate 12/21/23 0838 68     Resp 12/21/23 0838 18     Temp 12/21/23 0838 97.7 F (36.5 C)     Temp Source 12/21/23 0838 Oral     SpO2 12/21/23 0838 100 %     Weight 12/21/23 0839 165 lb (74.8 kg)     Height 12/21/23 0839 5' 7 (1.702 m)     Head Circumference --      Peak Flow --      Pain Score 12/21/23 0838 0     Pain Loc --      Pain Education --      Exclude from Growth Chart --     Most recent vital signs: Vitals:   12/21/23 0845 12/21/23 0847  BP: (!) 158/98   Pulse: 68   Resp: 20   Temp:    SpO2: 100% 100%   General: Awake, alert, oriented. CV:  Good peripheral perfusion. Regular rate and rhythm. Resp:  Normal effort. Lungs clear. Abd:  No distention. Non tender.  ED Results / Procedures / Treatments   Labs (all labs ordered are listed, but only abnormal results are displayed) Labs Reviewed  CBC WITH DIFFERENTIAL/PLATELET - Abnormal; Notable for the following components:      Result Value   RBC 6.04 (*)    Hemoglobin 17.4 (*)    Platelets 541 (*)    All other components within normal limits  COMPREHENSIVE METABOLIC PANEL WITH GFR - Abnormal; Notable for the following  components:   Potassium 3.3 (*)    Chloride 94 (*)    Glucose, Bld 105 (*)    BUN 21 (*)    Total Protein 8.4 (*)    Total Bilirubin 2.2 (*)    Anion gap 17 (*)    All other components within normal limits  LIPASE, BLOOD     EKG  None   RADIOLOGY None   PROCEDURES:  Critical Care performed: No    MEDICATIONS ORDERED IN ED: Medications - No data to display   IMPRESSION / MDM / ASSESSMENT AND PLAN / ED COURSE  I reviewed the triage vital signs and the nursing notes.                              Differential diagnosis includes, but is not limited to, pancreatitis, viral illness, gastritis  Patient's presentation is most consistent with acute presentation with potential threat to life or bodily function.   Presented to the  emergency department today because of concerns for nausea and vomiting that has been ongoing for the past few days.  Blood work here without concerning leukocytosis.  Does show slightly low potassium level which is consistent with poor oral intake.  Patient did feel better after IV fluids and medication here.  He was able to tolerate p.o.  Will plan on discharging with prescription for Zofran .     FINAL CLINICAL IMPRESSION(S) / ED DIAGNOSES   Final diagnoses:  Nausea and vomiting, unspecified vomiting type      Note:  This document was prepared using Dragon voice recognition software and may include unintentional dictation errors.    Floy Roberts, MD 12/21/23 [phone]

## 2023-12-21 NOTE — ED Triage Notes (Signed)
 Pt arrives via POV with c/o emesis since Saturday morning. Pt had ankle surgery and had been taking ibuprofen  for the pain but had their last dose on Monday. Pt stated that they thought it would be fine to go out drinking but hasn't been able to keep anything down since Saturday morning. Pt is A&Ox4 and ambulatory during triage.

## 2023-12-22 NOTE — Therapy (Unsigned)
 OUTPATIENT PHYSICAL THERAPY TREATMENT   Patient Name: Phillip Stephenson MRN: [id number] DOB:1980/05/07, 43 y.o., male Today's Date: 12/23/2023  END OF SESSION:  PT End of Session - 12/23/23 1034     Visit Number 2    Number of Visits 17    Date for Recertification  03/09/24    Authorization Type [place] MEDICAID HEALTHY BLUE reporting period from 12/16/23    Authorization Time Period HB auth# [id number] for 13 PT vsts from 10/20-1/20/26    Authorization - Visit Number 1    Authorization - Number of Visits 13    Progress Note Due on Visit 10    PT Start Time 1033    PT Stop Time 1117    PT Time Calculation (min) 44 min    Activity Tolerance Patient tolerated treatment well    Behavior During Therapy [facility] for tasks assessed/performed           Past Medical History:  Diagnosis Date   Acute metabolic acidosis 09/2023   likely from alcoholic ketoacidosis.   Elevated BP without diagnosis of hypertension 2025   Elevated liver enzymes 09/2023   ETOH abuse    STD (male) 2024   Substance abuse (HCC)    Trimalleolar fracture of ankle, closed, left, initial encounter 09/2023   Past Surgical History:  Procedure Laterality Date   HERNIA REPAIR     ORIF ANKLE FRACTURE Left 10/02/2023   Procedure: OPEN REDUCTION INTERNAL FIXATION (ORIF) ANKLE FRACTURE;  Surgeon: Janit Thresa HERO, DPM;  Location: [facility] ORS;  Service: Orthopedics/Podiatry;  Laterality: Left;   There are no active problems to display for this patient.   PCP: no PCP per patient  REFERRING PROVIDER: Janit Thresa HERO, DPM  REFERRING DIAG: trimalleolar fracture of ankle, closed, left, with routine healing  THERAPY DIAG:  Pain in left ankle and joints of left foot  Stiffness of left ankle, not elsewhere classified  Stiffness of left foot, not elsewhere classified  Muscle weakness (generalized)  Difficulty in walking, not elsewhere classified  Rationale for Evaluation and Treatment: Rehabilitation  ONSET DATE: fracture  09/19/2023, ORIF 10/02/2023  SUBJECTIVE:   PERTINENT HISTORY: Patient is a 43 y.o. male who presents to outpatient physical therapy with a referral for medical diagnosis trimalleolar fracture of ankle, closed, left, with routine healing. This patient's chief complaints consist of left ankle pain, stiffness, swelling, and weakness s/p L ankle ORIF 8/1/205 2/2 comminuted L ankle fracture after coming down wrong while playing basketball on 09/19/23, leading to the following functional deficits:  difficulty with working, working out, playing with son, walking, standing, running, sleeping, being up for more than 2-3 hours, wearing shoes for more than 2-3 hours. Relevant past medical history and comorbidities include the following: he  has a past medical history of Acute metabolic acidosis (09/2023), Elevated BP without diagnosis of hypertension (2025), Elevated liver enzymes (09/2023), ETOH abuse, STD (male) (2024), Substance abuse (HCC), and Trimalleolar fracture of ankle, closed, left, initial encounter (09/2023). he  has a past surgical history that includes Hernia repair and ORIF ankle fracture (Left, 10/02/2023). Patient denies hx of cancer, stroke, seizures, lung problems, heart problems, diabetes, unexplained weight loss, unexplained changes in bowel or bladder problems, unexplained stumbling or dropping things, osteoporosis, and spinal surgery  Exercise history: works out (lifting and treadmill)  SUBJECTIVE STATEMENT: Patient states his ankle has been feeling pretty good. He has not been taking meds for it. He continues to have a little bit of soreness. He has done some of the  HEP.   PAIN: NPRS: Current: 3/10 around left ankle over both sides and the front of it  From initial PT evaluation on 12/16/23:  NPRS: Best: 2/10, Worst: 6/10. Pain location: medial and lateral Left ankle, a little over the top of the left foot Pain description: achy, sharp pains here and there. Numbness/tingling:  denies Aggravating factors: doing too much for too long, going too much of a distance Relieving factors: laying down and elevating, ibuprofen    PRECAUTIONS: None  WEIGHT BEARING RESTRICTIONS: No  FALLS:  Has patient fallen in last 6 months? Yes. Number of falls when he broke his ankle while playing ball   PLOF: no limitations due to L ankle  PATIENT GOALS: To get back walking normal without a limp   OBJECTIVE  Vitals:   12/23/23 1042  BP: 120/84  Pulse: 91  SpO2: 100%    SELF-REPORTED FUNCTION Patient Specific Functional Scale (PSFS)  Working: 0/10 Going to the gym: 4/10 Playing with son: 4/10 Average: 2.7/10                                                                                                         TREATMENT Therapeutic exercise: therapeutic exercises that incorporate ONE parameter at one or more areas of the body to centralize symptoms, develop strength and endurance, range of motion, and flexibility required for successful completion of functional activities.  Vitals measurement for system's review (see above)  Reviewed HEP with assistance with setting up medbridge app Long sitting and seated L ankle DF stretch with knee extended using towel (good carry over) 1 x 10 seconds  Seated L ankle DF stretch with knee flexed using towel 1x10 seconds  Seated L ankle/toes PF stretch with top of foot on floor 1x10 seconds  (Superset with side stepping below) Seated figure 4 L ankle inversion against therband looped around balls of both feet  3x20-30 with RTB Added to HEP  Standing L gastroc stretch 1x30 seconds Felt in lateral calcaneal region Added to HEP  Standing L soleus stretch/ankle DF self mob 1x10 with 10 second holds Felt in anterior ankle Added to HEP  Education on HEP including handout     Therapeutic activities: dynamic therapeutic activities incorporating MULTIPLE parameters or areas of the body designed to achieve improved functional  performance.  (Superset with figure 4 inversion above) Lateral stepping with theraband wrapped under forefeet and crossed in front of body, keeping feet at least one foot width apart, to improve functional hip and ankle eversion for ambulatory activities.  3x15 steps each direction with GTB Added to HEP  Standing eccentric squat focusing on LE alignment (with chair behind) and functional stretch of L ankle 2x5 with 5 second eccentric phase and 5 second hold at bottom Felt strongly in L ankle Added to HEP  Pt required multimodal cuing for proper technique and to facilitate improved neuromuscular control, strength, range of motion, and functional ability resulting in improved performance and form.    PATIENT EDUCATION:  Education details: Exercise purpose/form. Self management techniques. Education on diagnosis, prognosis, POC, anatomy  and physiology of current condition. Education on HEP including handout. Person educated: Patient Education method: Explanation, Demonstration, Verbal cues, and Handouts Education comprehension: verbalized understanding, returned demonstration, and needs further education  HOME EXERCISE PROGRAM: Access Code: [email or web address] URL: [email or web address] Date: 12/23/2023 Prepared by: Camie Cleverly  Exercises - Gastroc Stretch on Wall  - 1 x daily - 2 sets - 1 reps - 30 seconds hold - Soleus Stretch on Wall  - 1 x daily - 2 sets - 10 reps - 10 seconds hold - Eccentric Squat  - 1 x daily - 2 sets - 5 reps - 10 seconds time to lower - Seated Anterior Tibialis Stretch  - 1-2 x daily - 5-10 reps - 10 seconds hold - Seated Figure 4 Ankle Inversion with Resistance  - 1 x daily - 2-3 sets - 20-30 reps - Side stepping with band  - 1 x daily - 2-3 sets - 10- 20 reps  ASSESSMENT:  CLINICAL IMPRESSION: Patient arrives reporting partial participation in HEP that was well tolerated. Added ankle strengthening and progressed mobility exercises with addition of  more functional positions and exercises. Patient tolerated well overall with pain increase of 2 points on NPRS by end of session (reasonable for post-op exercises). His HEP was updated and he was reminded that he is in charge of how hard he stretches his ankle with the exercises that felt most intense (stretches and squats) and to back off pressure if it is too sensitive. He is quite limited in ankle mobility with dorsiflexion limiting his functional mobility the most at this point. Patient would benefit from continued management of limiting condition by skilled physical therapist to address remaining impairments and functional limitations to work towards stated goals and return to PLOF or maximal functional independence.   From initial PT evaluation on 12/16/23:  Patient is a 43 y.o. male referred to outpatient physical therapy with a medical diagnosis of trimalleolar fracture of ankle, closed, left, with routine healing, who presents with signs and symptoms consistent with left ankle stiffness, pain, swelling, and weakness following ORIF on 10/02/23. Patient presents with significant pain, ROM, joint stiffness, edema, motor control, knowledge, muscle performance (Strength/power/endurance), balance, proprioception, and activity tolerance impairments that are limiting ability to complete usual activities such as working, working out, Scientist, physiological, playing with son, walking, standing, running, sleeping, being up for more than 2-3 hours, wearing shoes for more than 2-3 hours without difficulty. Patient will benefit from skilled physical therapy intervention to address current body structure impairments and activity limitations to improve function and work towards goals set in current POC in order to return to prior level of function or maximal functional improvement.    OBJECTIVE IMPAIRMENTS: Abnormal gait, decreased activity tolerance, decreased balance, decreased endurance, decreased knowledge of  condition, decreased mobility, difficulty walking, decreased ROM, decreased strength, hypomobility, increased edema, impaired flexibility, improper body mechanics, and pain.   ACTIVITY LIMITATIONS: carrying, standing, squatting, sleeping, stairs, transfers, bed mobility, and locomotion level  PARTICIPATION LIMITATIONS: interpersonal relationship, shopping, community activity, occupation, yard work, and   difficulty with working, playing basketball, working out, playing with son, walking, standing, running, sleeping, being up for more than 2-3 hours, wearing shoes for more than 2-3 hours  PERSONAL FACTORS: Past/current experiences, Time since onset of injury/illness/exacerbation, and 3+ comorbidities:  as a past medical history of Acute metabolic acidosis (09/2023), Elevated BP without diagnosis of hypertension (2025), Elevated liver enzymes (09/2023), ETOH abuse, STD (male) (2024), Substance abuse (HCC), and Trimalleolar fracture of ankle, closed,  left, initial encounter (09/2023). he  has a past surgical history that includes Hernia repair and ORIF ankle fracture (Left, 10/02/2023) are also affecting patient's functional outcome.   REHAB POTENTIAL: Good  CLINICAL DECISION MAKING: Stable/uncomplicated  EVALUATION COMPLEXITY: Low   GOALS: Goals reviewed with patient? No  SHORT TERM GOALS: Target date: 12/30/2023  Patient will be independent with initial home exercise program for self-management of symptoms. Baseline: Initial HEP provided at IE (12/16/23); Goal status: In progress   LONG TERM GOALS: Target date: 03/09/2024  Patient will be independent with a long-term home exercise program for self-management of symptoms.  Baseline: Initial HEP provided at IE (12/16/23); Goal status: In progress  2.  Patient will demonstrate improved Lower Extremity Functional Scale (LEFS) to equal or greater than 64/80 to demonstrate improvement in overall condition and self-reported functional ability.   Baseline: 61/80 (12/16/23); Goal status: In progress  3.  Patient will demonstrate L ankle and great toe PROM equal to R or at least at normal without increase in pain beyond intermittant end range discomfort to improve his ability to walk, run, and work.  Baseline: limited and painful - see objective  (12/16/23); Goal status: In progress  4.  Patient will demonstrate the ability to complete 10 single leg heel raises on the L LE to demonstrate improved strength for walking, working, and running.  Baseline: unable to get one rep (12/16/23); Goal status: In progress  5.  Patient will demonstrate improvement in Patient Specific Functional Scale (PSFS) of equal or greater than 8/10 points to reflect clinically significant improvement in patient's most valued functional activities. Baseline: to be measured at visit 2 as appropriate (12/16/23); 2.7/10 (12/23/23);  Goal status: In progress  6.  Patient will report NPRS equal or less than 3/10 during functional activities during the last 2 weeks to improve their abilitly to complete community, work and/or recreational activities with less limitation. Baseline: 6/10 (12/16/23); Goal status: In progress    PLAN:  PT FREQUENCY: 2x/week  PT DURATION: 8-12 weeks  PLANNED INTERVENTIONS: 97164- PT Re-evaluation, 97750- Physical Performance Testing, 97110-Therapeutic exercises, 97530- Therapeutic activity, [id number]- Neuromuscular re-education, 97535- Self Care, [id number]- Manual therapy, 97116- Gait training, [phone] (1-2 muscles), 20561 (3+ muscles)- Dry Needling, Patient/Family education, Balance training, Cryotherapy, and Moist heat  PLAN FOR NEXT SESSION: update HEP as appropriate, regain ROM, retraing motor control and proprioception,  improve strength, and resilience needed for  performing desired functional performance with appropriate ROM, strength, power, and endurance. Manual therapy as needed.    Camie SAUNDERS. Juli, PT, DPT, Cert. MDT 12/23/23,  12:32 PM  [facility] [address] [place], [place] [address] P: [phone] I F: [phone]

## 2023-12-23 ENCOUNTER — Ambulatory Visit: Admitting: Physical Therapy

## 2023-12-23 ENCOUNTER — Encounter: Payer: Self-pay | Admitting: Physical Therapy

## 2023-12-23 VITALS — BP 120/84 | HR 91

## 2023-12-23 DIAGNOSIS — M25572 Pain in left ankle and joints of left foot: Secondary | ICD-10-CM

## 2023-12-23 DIAGNOSIS — M25675 Stiffness of left foot, not elsewhere classified: Secondary | ICD-10-CM

## 2023-12-23 DIAGNOSIS — R262 Difficulty in walking, not elsewhere classified: Secondary | ICD-10-CM

## 2023-12-23 DIAGNOSIS — M25672 Stiffness of left ankle, not elsewhere classified: Secondary | ICD-10-CM

## 2023-12-23 DIAGNOSIS — M6281 Muscle weakness (generalized): Secondary | ICD-10-CM

## 2023-12-29 ENCOUNTER — Telehealth: Payer: Self-pay | Admitting: Physical Therapy

## 2023-12-29 ENCOUNTER — Ambulatory Visit: Admitting: Physical Therapy

## 2023-12-29 NOTE — Telephone Encounter (Signed)
 Spoke with patient notifying patient of missed PT visit scheduled at 11:15am today. He states he lost is paper and did not realize he had an appointment today, but also his son is with him unexpectedly today so he could not come anyway or reschedule today. No appointments open for the rest of the week currently so suggested he call back tomorrow to see if anything has changed. Confirmed next scheduled appt Monday 01/04/24 at 10:30am.   Let patient know that with any no-show I am required to review our cancellation policy that after 2 no-shows we remove future visits from the schedule, they are responsible to calling in to reschedule.   Camie SAUNDERS. Juli, PT, DPT 12/29/23, 11:54 AM  [facility] [address] [place], [place] [address] P: [phone] I F: [phone]

## 2024-01-04 ENCOUNTER — Encounter: Payer: Self-pay | Admitting: Physical Therapy

## 2024-01-04 ENCOUNTER — Ambulatory Visit: Attending: Podiatry | Admitting: Physical Therapy

## 2024-01-04 DIAGNOSIS — R262 Difficulty in walking, not elsewhere classified: Secondary | ICD-10-CM | POA: Diagnosis present

## 2024-01-04 DIAGNOSIS — M6281 Muscle weakness (generalized): Secondary | ICD-10-CM | POA: Diagnosis present

## 2024-01-04 DIAGNOSIS — M25672 Stiffness of left ankle, not elsewhere classified: Secondary | ICD-10-CM | POA: Insufficient documentation

## 2024-01-04 DIAGNOSIS — M25572 Pain in left ankle and joints of left foot: Secondary | ICD-10-CM | POA: Diagnosis present

## 2024-01-04 DIAGNOSIS — M25675 Stiffness of left foot, not elsewhere classified: Secondary | ICD-10-CM | POA: Insufficient documentation

## 2024-01-04 NOTE — Therapy (Signed)
 OUTPATIENT PHYSICAL THERAPY TREATMENT   Patient Name: Phillip Stephenson MRN: [id number] DOB:10/11/1980, 43 y.o., male Today's Date: 01/04/2024  END OF SESSION:  PT End of Session - 01/04/24 1109     Visit Number 3    Number of Visits 17    Date for Recertification  03/09/24    Authorization Type [place] MEDICA[employer] BLUE reporting period from 12/16/23    Authorization Time Period HB auth# [id number] for 13 PT vsts from 10/20-1/20/26    Authorization - Visit Number 2    Authorization - Number of Visits 13    Progress Note Due on Visit 10    PT Start Time 1040    PT Stop Time 1123    PT Time Calculation (min) 43 min    Activity Tolerance Patient tolerated treatment well    Behavior During Therapy [facility] for tasks assessed/performed           Past Medical History:  Diagnosis Date   Acute metabolic acidosis 09/2023   likely from alcoholic ketoacidosis.   Elevated BP without diagnosis of hypertension 2025   Elevated liver enzymes 09/2023   ETOH abuse    STD (male) 2024   Substance abuse (HCC)    Trimalleolar fracture of ankle, closed, left, initial encounter 09/2023   Past Surgical History:  Procedure Laterality Date   HERNIA REPAIR     ORIF ANKLE FRACTURE Left 10/02/2023   Procedure: OPEN REDUCTION INTERNAL FIXATION (ORIF) ANKLE FRACTURE;  Surgeon: Janit Thresa HERO, DPM;  Location: [facility] ORS;  Service: Orthopedics/Podiatry;  Laterality: Left;   There are no active problems to display for this patient.   PCP: no PCP per patient  REFERRING PROVIDER: Janit Thresa HERO, DPM  REFERRING DIAG: trimalleolar fracture of ankle, closed, left, with routine healing  THERAPY DIAG:  Pain in left ankle and joints of left foot  Stiffness of left ankle, not elsewhere classified  Stiffness of left foot, not elsewhere classified  Muscle weakness (generalized)  Difficulty in walking, not elsewhere classified  Rationale for Evaluation and Treatment: Rehabilitation  ONSET DATE: fracture  09/19/2023, ORIF 10/02/2023  SUBJECTIVE:   PERTINENT HISTORY: Patient is a 43 y.o. male who presents to outpatient physical therapy with a referral for medical diagnosis trimalleolar fracture of ankle, closed, left, with routine healing. This patient's chief complaints consist of left ankle pain, stiffness, swelling, and weakness s/p L ankle ORIF 8/1/205 2/2 comminuted L ankle fracture after coming down wrong while playing basketball on 09/19/23, leading to the following functional deficits:  difficulty with working, working out, playing with son, walking, standing, running, sleeping, being up for more than 2-3 hours, wearing shoes for more than 2-3 hours. Relevant past medical history and comorbidities include the following: he  has a past medical history of Acute metabolic acidosis (09/2023), Elevated BP without diagnosis of hypertension (2025), Elevated liver enzymes (09/2023), ETOH abuse, STD (male) (2024), Substance abuse (HCC), and Trimalleolar fracture of ankle, closed, left, initial encounter (09/2023). he  has a past surgical history that includes Hernia repair and ORIF ankle fracture (Left, 10/02/2023). Patient denies hx of cancer, stroke, seizures, lung problems, heart problems, diabetes, unexplained weight loss, unexplained changes in bowel or bladder problems, unexplained stumbling or dropping things, osteoporosis, and spinal surgery  Exercise history: works out (lifting and treadmill)  SUBJECTIVE STATEMENT: Patient states his ankle has been feeling sore. L ankle gets sore while doing his HEP but feels good after. Noted in the morning the ankle feels stiff in the morning but  improves with use.  PAIN: NPRS: Current: 3/10 soreness around anterior portion of L ankle ending at malleoli  From initial PT evaluation on 12/16/23:  NPRS: Best: 2/10, Worst: 6/10. Pain location: medial and lateral Left ankle, a little over the top of the left foot Pain description: achy, sharp pains here and there.  Numbness/tingling: denies Aggravating factors: doing too much for too long, going too much of a distance Relieving factors: laying down and elevating, ibuprofen    PRECAUTIONS: None  WEIGHT BEARING RESTRICTIONS: No  FALLS:  Has patient fallen in last 6 months? Yes. Number of falls when he broke his ankle while playing ball   PLOF: no limitations due to L ankle  PATIENT GOALS: To get back walking normal without a limp   OBJECTIVE                                                                                              TREATMENT Therapeutic exercise: therapeutic exercises that incorporate ONE parameter at one or more areas of the body to centralize symptoms, develop strength and endurance, range of motion, and flexibility required for successful completion of functional activities.  Vitals measurement for system's review (see above)  Recumbent bike for improved lower extremity ROM, muscular endurance, and activity tolerance; and to induce the analgesic effect of aerobic exercise, stimulate joint nutrition, and prepare body structures and systems for following interventions.  Seat setting: 16 6  minutes Level: 1 Target RPM: 50-60 RPM: 63  Reviewed HEP  Standing Bilateral gastroc stretch 2 x 30 seconds Felt in lateral calcaneal region  Standing L soleus stretch/ankle DF self mob 2 x 10 with 10 second holds  Seated L ankle/toes PF stretch with top of foot on floor 2 x 10 seconds  Education on HEP including handout    Therapeutic activities: dynamic therapeutic activities incorporating MULTIPLE parameters or areas of the body designed to achieve improved functional performance.  Lateral stepping with theraband wrapped under forefeet and crossed in front of body, keeping feet at least one foot width apart, to improve functional hip and ankle eversion for ambulatory activities.  1 x 15 steps each direction with RTB 2 x 15 steps each direction with GTB  Circuit  (one  additional exercise in neuromuscular Re-education)  Seated figure 4 L ankle inversion against therband looped around balls of both feet  2 x 30 with RTB   Standing bilateral heel raises with UE support on TM 2 x 15  Added to HEP  Neuromuscular Re-education: a technique or exercise performed with the goal of improving the level of communication between the body and the brain, such as for balance, motor control, muscle activation patterns, coordination, desensitization, quality of muscle contraction, proprioception, and/or kinesthetic sense needed for successful and safe completion of functional activities.   (Part of circuit above) Standing eccentric squat focusing on LE alignment (with chair behind) and functional stretch of L ankle 2 x 5 with 5 second eccentric phase and 5 second hold at bottom Felt soreness in L ankle    Single leg stance, bilateral, UE support on TM as need  3 x 30 second hold  Decreased balance on L leg with trials decreasing to a 17 sec hold then 14 sec hold (Trial ending when opposite foot touches the floor)    Added to HEP  Pt required multimodal cuing for proper technique and to facilitate improved neuromuscular control, strength, range of motion, and functional ability resulting in improved performance and form.   PATIENT EDUCATION:  Education details: Exercise purpose/form. Self management techniques. Education on diagnosis, prognosis, POC, anatomy and physiology of current condition. Education on HEP including handout. Person educated: Patient Education method: Explanation, Demonstration, Verbal cues, and Handouts Education comprehension: verbalized understanding, returned demonstration, and needs further education  HOME EXERCISE PROGRAM: Access Code: [email or web address] URL: [email or web address] Date: 01/04/2024 Prepared by: Vernell Rise  Exercises - Gastroc Stretch on Wall  - 1 x daily - 2 sets - 1 reps - 30 seconds hold - Soleus Stretch on  Wall  - 1 x daily - 2 sets - 10 reps - 10seconds hold - Eccentric Squat  - 1 x daily - 2 sets - 5 reps - 10 seconds time to lower - Seated Anterior Tibialis Stretch  - 1-2 x daily - 5-10 reps - 10 seconds hold - Seated Figure 4 Ankle Inversion with Resistance  - 1 x daily - 2-3 sets - 20-30 reps - Side stepping with band  - 1 x daily - 2-3 sets - 10- 20 reps - Standing Heel Raise  - 1 x daily - 3 sets - 15 reps - Standing Single Leg Stance with Counter Support  - 1-2 x daily - 3 sets - 30 hold  ASSESSMENT:  CLINICAL IMPRESSION: Patient arrives reporting full participation in HEP that was well tolerated with noticeable soreness when completing but subsides when completed. Progress lateral stepping to GTB with some noted soreness in L ankle. Patient tolerated well overall with soreness increasing with exercises (subsided with breaks between exercises). His HEP was updated and was educated that if any of the new exercises cause pain to cease that exercise. He is limited in ankle inversion and eversion at this time as well as lacking in balance. Patient would benefit from continued management of limiting condition by skilled physical therapist to address remaining impairments and functional limitations to work towards stated goals and return to PLOF or maximal functional independence.   From initial PT evaluation on 12/16/23:  Patient is a 43 y.o. male referred to outpatient physical therapy with a medical diagnosis of trimalleolar fracture of ankle, closed, left, with routine healing, who presents with signs and symptoms consistent with left ankle stiffness, pain, swelling, and weakness following ORIF on 10/02/23. 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 Patient will benefit from skilled physical therapy intervention to address current body structure impairments and activity limitations to improve function and work towards goals set in current POC in order to return to prior level of function or maximal functional improvement.    OBJECTIVE IMPAIRMENTS: Abnormal gait, decreased activity tolerance, decreased balance, decreased endurance, decreased knowledge of condition, decreased mobility, difficulty walking, decreased ROM, decreased strength, hypomobility, increased edema, impaired flexibility, improper body mechanics, and pain.   ACTIVITY LIMITATIONS: carrying, standing, squatting, sleeping, stairs, transfers, bed mobility,  and locomotion level  PARTICIPATION LIMITATIONS: interpersonal relationship, shopping, community activity, occupation, yard work, and   difficulty with working, playing basketball, working out, playing with son, walking, standing, running, sleeping, being up for more than 2-3 hours, wearing shoes for more than 2-3 hours  PERSONAL FACTORS: Past/current experiences, Time since onset of injury/illness/exacerbation, and 3+ comorbidities:  as a past medical history of Acute metabolic acidosis (09/2023), Elevated BP without diagnosis of hypertension (2025), Elevated liver enzymes (09/2023), ETOH abuse, STD (male) (2024), Substance abuse (HCC), and Trimalleolar fracture of ankle, closed, left, initial encounter (09/2023). he  has a past surgical history that includes Hernia repair and ORIF ankle fracture (Left, 10/02/2023) are also affecting patient's functional outcome.   REHAB POTENTIAL: Good  CLINICAL DECISION MAKING: Stable/uncomplicated  EVALUATION COMPLEXITY: Low   GOALS: Goals reviewed with patient? No  SHORT TERM GOALS: Target date: 12/30/2023  Patient will be  independent with initial home exercise program for self-management of symptoms. Baseline: Initial HEP provided at IE (12/16/23); Goal status: In progress   LONG TERM GOALS: Target date: 03/09/2024  Patient will be independent with a long-term home exercise program for self-management of symptoms.  Baseline: Initial HEP provided at IE (12/16/23); Goal status: In progress  2.  Patient will demonstrate improved Lower Extremity Functional Scale (LEFS) to equal or greater than 64/80 to demonstrate improvement in overall condition and self-reported functional ability.  Baseline: 61/80 (12/16/23); Goal status: In progress  3.  Patient will demonstrate L ankle and great toe PROM equal to R or at least at normal without increase in pain beyond intermittant end range discomfort to improve his ability to walk, run, and work.  Baseline: limited and painful - see objective  (12/16/23); Goal status: In progress  4.  Patient will demonstrate the ability to complete 10 single leg heel raises on the L LE to demonstrate improved strength for walking, working, and running.  Baseline: unable to get one rep (12/16/23); Goal status: In progress  5.  Patient will demonstrate improvement in Patient Specific Functional Scale (PSFS) of equal or greater than 8/10 points to reflect clinically significant improvement in patient's most valued functional activities. Baseline: to be measured at visit 2 as appropriate (12/16/23); 2.7/10 (12/23/23);  Goal status: In progress  6.  Patient will report NPRS equal or less than 3/10 during functional activities during the last 2 weeks to improve their abilitly to complete community, work and/or recreational activities with less limitation. Baseline: 6/10 (12/16/23); Goal status: In progress   PLAN:  PT FREQUENCY: 2x/week  PT DURATION: 8-12 weeks  PLANNED INTERVENTIONS: 97164- PT Re-evaluation, 97750- Physical Performance Testing, 97110-Therapeutic exercises, 97530-  Therapeutic activity, [id number]- Neuromuscular re-education, 97535- Self Care, [id number]- Manual therapy, [id number]- Gait training, [phone] (1-2 muscles), 20561 (3+ muscles)- Dry Needling, Patient/Family education, Balance training, Cryotherapy, and Moist heat  PLAN FOR NEXT SESSION: Continue working on balance, proprioception, regain ROM, improve strength, endurance and motor control. Update HEP as appropriate. Consider adding balance tasks with additional perturbations if balance is improved with single leg stance.  Vernell Rise, SPT  Phillip Stephenson. Juli, PT, DPT, Cert. MDT 01/04/24, 1:09 PM  [facility] [address] [place], [place] [address] P: [phone] I F: [phone]

## 2024-01-06 ENCOUNTER — Ambulatory Visit: Admitting: Physical Therapy

## 2024-01-06 ENCOUNTER — Encounter: Payer: Self-pay | Admitting: Physical Therapy

## 2024-01-06 DIAGNOSIS — M25572 Pain in left ankle and joints of left foot: Secondary | ICD-10-CM

## 2024-01-06 DIAGNOSIS — M6281 Muscle weakness (generalized): Secondary | ICD-10-CM

## 2024-01-06 DIAGNOSIS — M25675 Stiffness of left foot, not elsewhere classified: Secondary | ICD-10-CM

## 2024-01-06 DIAGNOSIS — R262 Difficulty in walking, not elsewhere classified: Secondary | ICD-10-CM

## 2024-01-06 DIAGNOSIS — M25672 Stiffness of left ankle, not elsewhere classified: Secondary | ICD-10-CM

## 2024-01-06 NOTE — Therapy (Unsigned)
 OUTPATIENT PHYSICAL THERAPY TREATMENT   Patient Name: Phillip Stephenson MRN: [id number] DOB:[birth year], 43 y.o., male Today's Date: 01/06/2024  END OF SESSION:  PT End of Session - 01/06/24 1111     Visit Number 4    Number of Visits 17    Date for Recertification  03/09/24    Authorization Type [place] [employer] reporting period from 12/16/23    Authorization Time Period HB auth# [id number] for 13 PT vsts from 10/20-1/20/26    Authorization - Number of Visits 13    Progress Note Due on Visit 10    PT Start Time 1122    PT Stop Time 1203    PT Time Calculation (min) 41 min    Activity Tolerance Patient tolerated treatment well    Behavior During Therapy [facility] for tasks assessed/performed         Past Medical History:  Diagnosis Date   Acute metabolic acidosis 09/2023   likely from alcoholic ketoacidosis.   Elevated BP without diagnosis of hypertension 2025   Elevated liver enzymes 09/2023   ETOH abuse    STD (male) 2024   Substance abuse (HCC)    Trimalleolar fracture of ankle, closed, left, initial encounter 09/2023   Past Surgical History:  Procedure Laterality Date   HERNIA REPAIR     ORIF ANKLE FRACTURE Left 10/02/2023   Procedure: OPEN REDUCTION INTERNAL FIXATION (ORIF) ANKLE FRACTURE;  Surgeon: Janit Thresa HERO, DPM;  Location: [facility] ORS;  Service: Orthopedics/Podiatry;  Laterality: Left;   There are no active problems to display for this patient.   PCP: no PCP per patient  REFERRING PROVIDER: Janit Thresa HERO, DPM  REFERRING DIAG: trimalleolar fracture of ankle, closed, left, with routine healing  THERAPY DIAG:  Pain in left ankle and joints of left foot  Stiffness of left ankle, not elsewhere classified  Stiffness of left foot, not elsewhere classified  Difficulty in walking, not elsewhere classified  Muscle weakness (generalized)  Rationale for Evaluation and Treatment: Rehabilitation  ONSET DATE: fracture 09/19/2023, ORIF 10/02/2023  SUBJECTIVE:    PERTINENT HISTORY: Patient is a 43 y.o. male who presents to outpatient physical therapy with a referral for medical diagnosis trimalleolar fracture of ankle, closed, left, with routine healing. This patient's chief complaints consist of left ankle pain, stiffness, swelling, and weakness s/p L ankle ORIF 8/1/205 2/2 comminuted L ankle fracture after coming down wrong while playing basketball on 09/19/23, leading to the following functional deficits:  difficulty with working, working out, playing with son, walking, standing, running, sleeping, being up for more than 2-3 hours, wearing shoes for more than 2-3 hours. Relevant past medical history and comorbidities include the following: he  has a past medical history of Acute metabolic acidosis (09/2023), Elevated BP without diagnosis of hypertension (2025), Elevated liver enzymes (09/2023), ETOH abuse, STD (male) (2024), Substance abuse (HCC), and Trimalleolar fracture of ankle, closed, left, initial encounter (09/2023). he  has a past surgical history that includes Hernia repair and ORIF ankle fracture (Left, 10/02/2023). Patient denies hx of cancer, stroke, seizures, lung problems, heart problems, diabetes, unexplained weight loss, unexplained changes in bowel or bladder problems, unexplained stumbling or dropping things, osteoporosis, and spinal surgery  Exercise history: works out (lifting and treadmill)  SUBJECTIVE STATEMENT: Patient stated he  did less on his HEP as he knew he was coming in here to do more work. Has been having some soreness in the ankle, was unable to attend a football game due to not being able to  walk for that long. Stated the single leg stances are still causing some troubles in terms of keeping his balance.  PAIN: NPRS: Current: no pain just soreness in anterior L ankle and toward medial and lateral malleoli  From initial PT evaluation on 12/16/23:  NPRS: Best: 2/10, Worst: 6/10. Pain location: medial and lateral Left ankle, a  little over the top of the left foot Pain description: achy, sharp pains here and there. Numbness/tingling: denies Aggravating factors: doing too much for too long, going too much of a distance Relieving factors: laying down and elevating, ibuprofen    PRECAUTIONS: None  WEIGHT BEARING RESTRICTIONS: No  FALLS:  Has patient fallen in last 6 months? Yes. Number of falls when he broke his ankle while playing ball   PLOF: no limitations due to L ankle  PATIENT GOALS: To get back walking normal without a limp   OBJECTIVE                                                                                              TREATMENT Therapeutic exercise: therapeutic exercises that incorporate ONE parameter at one or more areas of the body to centralize symptoms, develop strength and endurance, range of motion, and flexibility required for successful completion of functional activities.  Recumbent bike for improved lower extremity ROM, muscular endurance, and activity tolerance; and to induce the analgesic effect of aerobic exercise, stimulate joint nutrition, and prepare body structures and systems for following interventions.  Seat setting: 16 6  minutes Level: 1 Target RPM: 65 RPM: 70 RPE: 4/10  Reviewed HEP  Standing Bilateral gastroc stretch 2 x 30 seconds  Standing L soleus stretch/ankle DF self mob 2 x 10 with 10 second holds  Seated L ankle/toes PF stretch with top of foot on floor 2 x 5 at 10 seconds  Therapeutic activities: dynamic therapeutic activities incorporating MULTIPLE parameters or areas of the body designed to achieve improved functional performance.  Lateral stepping with theraband wrapped under forefeet and crossed in front of body, keeping feet at least one foot width apart, to improve functional hip and ankle eversion for ambulatory activities.  3 x 15 steps each direction with GTB  Seated figure 4 L ankle inversion against therband looped around balls of both  feet  3 x 30 with RTB  Able to self cue for foot placement  Standing bilateral heel raises with UE support on TM as needed 2 x 15   Neuromuscular Re-education: a technique or exercise performed with the goal of improving the level of communication between the body and the brain, such as for balance, motor control, muscle activation patterns, coordination, desensitization, quality of muscle contraction, proprioception, and/or kinesthetic sense needed for successful and safe completion of functional activities.   Standing eccentric squat focusing on LE alignment (with chair behind) and functional stretch of L ankle 1 x 5 with 5 second eccentric phase and 5 second hold at bottom 1 x 5 with 5 second hold with heels elevated on slant board  Cued patient to put knees in front on feet    Single leg stance, L, UE support on  TM as need    3 x 30 second hold      1st Trial : 30 seconds Intermittent UE support     2nd Trial: 17 seconds before needing to touch R foot     3rd Trial: 30 seconds intermittent UE support  Single leg stance with 3 way toe touch, L, UE support on TM as needed  2 x 10 Cued patient to only touch toe to the ground, no UE support needed  Pt required multimodal cuing for proper technique and to facilitate improved neuromuscular control, strength, range of motion, and functional ability resulting in improved performance and form.  41 min total: 10 min TE, 10 min TA, 21 min Neuro  PATIENT EDUCATION:  Education details: Exercise purpose/form. Self management techniques. Education on diagnosis, prognosis, POC, anatomy and physiology of current condition. Education on HEP including handout. Person educated: Patient Education method: Explanation, Demonstration, Verbal cues, and Handouts Education comprehension: verbalized understanding, returned demonstration, and needs further education  HOME EXERCISE PROGRAM: Access Code: [email or web address] URL:  [email or web address] Date: 01/06/2024 Prepared by: Vernell Rise  Exercises - Gastroc Stretch on Wall  - 1 x daily - 2 sets - 1 reps - 30 seconds hold - Soleus Stretch on Wall  - 1 x daily - 2 sets - 10 reps - 10seconds hold - Eccentric Squat  - 1 x daily - 2 sets - 5 reps - 10 seconds time to lower - Seated Anterior Tibialis Stretch  - 1-2 x daily - 5-10 reps - 10 seconds hold - Seated Figure 4 Ankle Inversion with Resistance  - 1 x daily - 2-3 sets - 20-30 reps - Side stepping with band  - 1 x daily - 2-3 sets - 10- 20 reps - Standing Heel Raise  - 1 x daily - 3 sets - 15 reps - Standing Single Leg Stance with Counter Support  - 1-2 x daily - 3 sets - 30 hold - Standing 3-Way Kick  - 2 x daily - 3 sets - 10 reps  ASSESSMENT:  CLINICAL IMPRESSION: Patient was able to complete single leg stance with 3 way toe touch with no UE support on TM, added to HEP. Noted he was shifted during his squats and was able to self correct. Patient noted a 3/10 pain at the end of the session. Patient would benefit from continued management of limiting condition by skilled physical therapist to address remaining impairments and functional limitations to work towards stated goals and return to PLOF or maximal functional independence.   From initial PT evaluation on 12/16/23:  Patient is a 43 y.o. male referred to outpatient physical therapy with a medical diagnosis of trimalleolar fracture of ankle, closed, left, with routine healing, who presents with signs and symptoms consistent with left ankle stiffness, pain, swelling, and weakness following ORIF on 10/02/23. Patient presents with significant pain, ROM, joint stiffness, edema, motor control, knowledge, muscle performance (Strength/power/endurance), balance, proprioception, and activity tolerance impairments that are limiting ability to complete usual activities such as working, working out, scientist, physiological, playing with son, walking, standing,  running, sleeping, being up for more than 2-3 hours, wearing shoes for more than 2-3 hours without difficulty. Patient will benefit from skilled physical therapy intervention to address current body structure impairments and activity limitations to improve function and work towards goals set in current POC in order to return to prior level of function or maximal functional improvement.    OBJECTIVE IMPAIRMENTS: Abnormal gait, decreased activity tolerance, decreased balance, decreased  endurance, decreased knowledge of condition, decreased mobility, difficulty walking, decreased ROM, decreased strength, hypomobility, increased edema, impaired flexibility, improper body mechanics, and pain.   ACTIVITY LIMITATIONS: carrying, standing, squatting, sleeping, stairs, transfers, bed mobility, and locomotion level  PARTICIPATION LIMITATIONS: interpersonal relationship, shopping, community activity, occupation, yard work, and difficulty with working, playing basketball, working out, playing with son, walking, standing, running, sleeping, being up for more than 2-3 hours, wearing shoes for more than 2-3 hours  PERSONAL FACTORS: Past/current experiences, Time since onset of injury/illness/exacerbation, and 3+ comorbidities: as a past medical history of Acute metabolic acidosis (09/2023), Elevated BP without diagnosis of hypertension (2025), Elevated liver enzymes (09/2023), ETOH abuse, STD (male) (2024), Substance abuse (HCC), and Trimalleolar fracture of ankle, closed, left, initial encounter (09/2023). he  has a past surgical history that includes Hernia repair and ORIF ankle fracture (Left, 10/02/2023) are also affecting patient's functional outcome.   REHAB POTENTIAL: Good  CLINICAL DECISION MAKING: Stable/uncomplicated  EVALUATION COMPLEXITY: Low   GOALS: Goals reviewed with patient? No  SHORT TERM GOALS: Target date: 12/30/2023  Patient will be independent with initial home exercise program for  self-management of symptoms. Baseline: Initial HEP provided at IE (12/16/23); Goal status: In progress   LONG TERM GOALS: Target date: 03/09/2024  Patient will be independent with a long-term home exercise program for self-management of symptoms.  Baseline: Initial HEP provided at IE (12/16/23); Goal status: In progress  2.  Patient will demonstrate improved Lower Extremity Functional Scale (LEFS) to equal or greater than 64/80 to demonstrate improvement in overall condition and self-reported functional ability.  Baseline: 61/80 (12/16/23); Goal status: In progress  3.  Patient will demonstrate L ankle and great toe PROM equal to R or at least at normal without increase in pain beyond intermittant end range discomfort to improve his ability to walk, run, and work.  Baseline: limited and painful - see objective  (12/16/23); Goal status: In progress  4.  Patient will demonstrate the ability to complete 10 single leg heel raises on the L LE to demonstrate improved strength for walking, working, and running.  Baseline: unable to get one rep (12/16/23); Goal status: In progress  5.  Patient will demonstrate improvement in Patient Specific Functional Scale (PSFS) of equal or greater than 8/10 points to reflect clinically significant improvement in patient's most valued functional activities. Baseline: to be measured at visit 2 as appropriate (12/16/23); 2.7/10 (12/23/23);  Goal status: In progress  6.  Patient will report NPRS equal or less than 3/10 during functional activities during the last 2 weeks to improve their abilitly to complete community, work and/or recreational activities with less limitation. Baseline: 6/10 (12/16/23); Goal status: In progress   PLAN:  PT FREQUENCY: 2x/week  PT DURATION: 8-12 weeks  PLANNED INTERVENTIONS: 97164- PT Re-evaluation, 97750- Physical Performance Testing, 97110-Therapeutic exercises, 97530- Therapeutic activity, [id number]- Neuromuscular  re-education, 97535- Self Care, [id number]- Manual therapy, [id number]- Gait training, [phone] (1-2 muscles), 20561 (3+ muscles)- Dry Needling, Patient/Family education, Balance training, Cryotherapy, and Moist heat  PLAN FOR NEXT SESSION: Continue working on single leg balance, proprioception, regain ROM, improve strength, endurance and motor control. Update HEP as appropriate. Consider adding airex foam with ball throws.  Vernell Rise, SPT  [facility] [address] [place], [place] [address] P: [phone] I F: [phone]

## 2024-01-07 ENCOUNTER — Encounter: Payer: Self-pay | Admitting: Physical Therapy

## 2024-01-07 NOTE — Therapy (Signed)
 OUTPATIENT PHYSICAL THERAPY TREATMENT   Patient Name: Phillip Stephenson MRN: [id number] DOB:Feb 20, 1981, 42 y.o., male Today's Date: 01/11/2024  END OF SESSION:  PT End of Session - 01/11/24 1429     Visit Number 5    Number of Visits 17    Date for Recertification  03/09/24    Authorization Type [place] [employer] reporting period from 12/16/23    Authorization Time Period HB auth# [id number] for 13 PT vsts from 10/20-1/20/26    Authorization - Number of Visits 13    Progress Note Due on Visit 10    PT Start Time 1429    PT Stop Time 1510    PT Time Calculation (min) 41 min    Activity Tolerance Patient tolerated treatment well    Behavior During Therapy [facility] for tasks assessed/performed          Past Medical History:  Diagnosis Date   Acute metabolic acidosis 09/2023   likely from alcoholic ketoacidosis.   Elevated BP without diagnosis of hypertension 2025   Elevated liver enzymes 09/2023   ETOH abuse    STD (male) 2024   Substance abuse (HCC)    Trimalleolar fracture of ankle, closed, left, initial encounter 09/2023   Past Surgical History:  Procedure Laterality Date   HERNIA REPAIR     ORIF ANKLE FRACTURE Left 10/02/2023   Procedure: OPEN REDUCTION INTERNAL FIXATION (ORIF) ANKLE FRACTURE;  Surgeon: Janit Thresa HERO, DPM;  Location: [facility] ORS;  Service: Orthopedics/Podiatry;  Laterality: Left;   There are no active problems to display for this patient.   PCP: no PCP per patient  REFERRING PROVIDER: Janit Thresa HERO, DPM  REFERRING DIAG: trimalleolar fracture of ankle, closed, left, with routine healing  THERAPY DIAG:  Pain in left ankle and joints of left foot  Stiffness of left ankle, not elsewhere classified  Stiffness of left foot, not elsewhere classified  Difficulty in walking, not elsewhere classified  Rationale for Evaluation and Treatment: Rehabilitation  ONSET DATE: fracture 09/19/2023, ORIF 10/02/2023  SUBJECTIVE:   PERTINENT HISTORY: Patient  is a 43 y.o. male who presents to outpatient physical therapy with a referral for medical diagnosis trimalleolar fracture of ankle, closed, left, with routine healing. This patient's chief complaints consist of left ankle pain, stiffness, swelling, and weakness s/p L ankle ORIF 8/1/205 2/2 comminuted L ankle fracture after coming down wrong while playing basketball on 09/19/23, leading to the following functional deficits:  difficulty with working, working out, playing with son, walking, standing, running, sleeping, being up for more than 2-3 hours, wearing shoes for more than 2-3 hours. Relevant past medical history and comorbidities include the following: he  has a past medical history of Acute metabolic acidosis (09/2023), Elevated BP without diagnosis of hypertension (2025), Elevated liver enzymes (09/2023), ETOH abuse, STD (male) (2024), Substance abuse (HCC), and Trimalleolar fracture of ankle, closed, left, initial encounter (09/2023). he  has a past surgical history that includes Hernia repair and ORIF ankle fracture (Left, 10/02/2023). Patient denies hx of cancer, stroke, seizures, lung problems, heart problems, diabetes, unexplained weight loss, unexplained changes in bowel or bladder problems, unexplained stumbling or dropping things, osteoporosis, and spinal surgery  Exercise history: works out (lifting and treadmill)  SUBJECTIVE STATEMENT: Patient stated he was stiff when he woke up today and thinks it's due to the weather. Did some walking this weekend and was sore after but not in any pain. Stated he didn't do his HEP over the weekend due to being sore from  the work in here.  PAIN: NPRS: Current: no pain, just soreness of anterior ankle and out toward lateral malleoli  From initial PT evaluation on 12/16/23:  NPRS: Best: 2/10, Worst: 6/10. Pain location: medial and lateral Left ankle, a little over the top of the left foot Pain description: achy, sharp pains here and there.  Numbness/tingling: denies Aggravating factors: doing too much for too long, going too much of a distance Relieving factors: laying down and elevating, ibuprofen    PRECAUTIONS: None  WEIGHT BEARING RESTRICTIONS: No  FALLS:  Has patient fallen in last 6 months? Yes. Number of falls when he broke his ankle while playing ball   PLOF: no limitations due to L ankle  PATIENT GOALS: To get back walking normal without a limp   OBJECTIVE                                                                                              TREATMENT Therapeutic exercise: therapeutic exercises that incorporate ONE parameter at one or more areas of the body to centralize symptoms, develop strength and endurance, range of motion, and flexibility required for successful completion of functional activities.  Recumbent bike for improved lower extremity ROM, muscular endurance, and activity tolerance; and to induce the analgesic effect of aerobic exercise, stimulate joint nutrition, and prepare body structures and systems for following interventions.  Seat setting: 16 6  minutes Level: 2 Target RPM: 75 RPM: 71 RPE: 6/10  Seated figure 4 L ankle inversion against therband looped around balls of both feet  2 x 30 with RTB   Standing Bilateral gastroc stretch 2 x 30 seconds  Standing L soleus stretch/ankle DF self mob 2 x 10 with 10 second holds  Seated L ankle/toes PF stretch with top of foot on floor 2 x 5 at 10 seconds  Therapeutic activities: dynamic therapeutic activities incorporating MULTIPLE parameters or areas of the body designed to achieve improved functional performance.  Lateral stepping with theraband wrapped under forefeet and crossed in front of body, keeping feet at least one foot width apart, to improve functional hip and ankle eversion for ambulatory activities.  3 x 15 steps each direction with GTB  Standing bilateral heel raises with UE support on TM as needed 1 x 15 1 x 15 up  with both heels , weight shift to L and down on L leg Attempted one rep with single leg both concentric and eccentrically but was too challenging   Standing eccentric squat focusing on LE alignment with heels elevated on slant board and functional stretch of L ankle 2 x 5 with 5 second hold with heels elevated on slant board   Neuromuscular Re-education: a technique or exercise performed with the goal of improving the level of communication between the body and the brain, such as for balance, motor control, muscle activation patterns, coordination, desensitization, quality of muscle contraction, proprioception, and/or kinesthetic sense needed for successful and safe completion of functional activities.   Single leg stance with 3 way toe touch, L, UE support on TM as needed  2 x 10 Cued patient to only touch toe to  the ground      Standing ball toss on foam pad    5 x 10 with 2kg ball    Cued patient to stand with feet apart and slight knee bend  Pt required multimodal cuing for proper technique and to facilitate improved neuromuscular control, strength, range of motion, and functional ability resulting in improved performance and form.  PATIENT EDUCATION:  Education details: Exercise purpose/form. Self management techniques. Education on diagnosis, prognosis, POC, anatomy and physiology of current condition. Education on HEP including handout. Person educated: Patient Education method: Explanation, Demonstration, Verbal cues, and Handouts Education comprehension: verbalized understanding, returned demonstration, and needs further education  HOME EXERCISE PROGRAM: Access Code: [email or web address] URL: [email or web address] Date: 01/06/2024 Prepared by: Vernell Rise  Exercises - Gastroc Stretch on Wall  - 1 x daily - 2 sets - 1 reps - 30 seconds hold - Soleus Stretch on Wall  - 1 x daily - 2 sets - 10 reps - 10seconds hold - Eccentric Squat  - 1 x daily - 2 sets - 5 reps - 10  seconds time to lower - Seated Anterior Tibialis Stretch  - 1-2 x daily - 5-10 reps - 10 seconds hold - Seated Figure 4 Ankle Inversion with Resistance  - 1 x daily - 2-3 sets - 20-30 reps - Side stepping with band  - 1 x daily - 2-3 sets - 10- 20 reps - Standing Heel Raise  - 1 x daily - 3 sets - 15 reps - Standing Single Leg Stance with Counter Support  - 1-2 x daily - 3 sets - 30 hold - Standing 3-Way Kick  - 2 x daily - 3 sets - 10 reps  ASSESSMENT:  CLINICAL IMPRESSION: Patient was able to self correct knee placement during his squats. Patient noted difficulty with balance on foam and ball throws due to instability in the ankle. Patient noted additional soreness in the anterior ankle and out toward the malleoli at the completion of PT. Patient will benefit from skilled physical therapy intervention to address current body structure impairments and activity limitations to improve function and work towards goals set in current POC in order to return to prior level of function or maximal functional improvement.   From initial PT evaluation on 12/16/23:  Patient is a 43 y.o. male referred to outpatient physical therapy with a medical diagnosis of trimalleolar fracture of ankle, closed, left, with routine healing, who presents with signs and symptoms consistent with left ankle stiffness, pain, swelling, and weakness following ORIF on 10/02/23. Patient presents with significant pain, ROM, joint stiffness, edema, motor control, knowledge, muscle performance (Strength/power/endurance), balance, proprioception, and activity tolerance impairments that are limiting ability to complete usual activities such as working, working out, scientist, physiological, playing with son, walking, standing, running, sleeping, being up for more than 2-3 hours, wearing shoes for more than 2-3 hours without difficulty. Patient will benefit from skilled physical therapy intervention to address current body structure impairments and  activity limitations to improve function and work towards goals set in current POC in order to return to prior level of function or maximal functional improvement.    OBJECTIVE IMPAIRMENTS: Abnormal gait, decreased activity tolerance, decreased balance, decreased endurance, decreased knowledge of condition, decreased mobility, difficulty walking, decreased ROM, decreased strength, hypomobility, increased edema, impaired flexibility, improper body mechanics, and pain.   ACTIVITY LIMITATIONS: carrying, standing, squatting, sleeping, stairs, transfers, bed mobility, and locomotion level  PARTICIPATION LIMITATIONS: interpersonal relationship, shopping, community activity, occupation, yard work, and difficulty with  working, playing basketball, working out, playing with son, walking, standing, running, sleeping, being up for more than 2-3 hours, wearing shoes for more than 2-3 hours  PERSONAL FACTORS: Past/current experiences, Time since onset of injury/illness/exacerbation, and 3+ comorbidities: as a past medical history of Acute metabolic acidosis (09/2023), Elevated BP without diagnosis of hypertension (2025), Elevated liver enzymes (09/2023), ETOH abuse, STD (male) (2024), Substance abuse (HCC), and Trimalleolar fracture of ankle, closed, left, initial encounter (09/2023). he  has a past surgical history that includes Hernia repair and ORIF ankle fracture (Left, 10/02/2023) are also affecting patient's functional outcome.   REHAB POTENTIAL: Good  CLINICAL DECISION MAKING: Stable/uncomplicated  EVALUATION COMPLEXITY: Low   GOALS: Goals reviewed with patient? No  SHORT TERM GOALS: Target date: 12/30/2023  Patient will be independent with initial home exercise program for self-management of symptoms. Baseline: Initial HEP provided at IE (12/16/23); Goal status: In progress   LONG TERM GOALS: Target date: 03/09/2024  Patient will be independent with a long-term home exercise program for  self-management of symptoms.  Baseline: Initial HEP provided at IE (12/16/23); Goal status: In progress  2.  Patient will demonstrate improved Lower Extremity Functional Scale (LEFS) to equal or greater than 64/80 to demonstrate improvement in overall condition and self-reported functional ability.  Baseline: 61/80 (12/16/23); Goal status: In progress  3.  Patient will demonstrate L ankle and great toe PROM equal to R or at least at normal without increase in pain beyond intermittant end range discomfort to improve his ability to walk, run, and work.  Baseline: limited and painful - see objective  (12/16/23); Goal status: In progress  4.  Patient will demonstrate the ability to complete 10 single leg heel raises on the L LE to demonstrate improved strength for walking, working, and running.  Baseline: unable to get one rep (12/16/23); Goal status: In progress  5.  Patient will demonstrate improvement in Patient Specific Functional Scale (PSFS) of equal or greater than 8/10 points to reflect clinically significant improvement in patient's most valued functional activities. Baseline: to be measured at visit 2 as appropriate (12/16/23); 2.7/10 (12/23/23);  Goal status: In progress  6.  Patient will report NPRS equal or less than 3/10 during functional activities during the last 2 weeks to improve their abilitly to complete community, work and/or recreational activities with less limitation. Baseline: 6/10 (12/16/23); Goal status: In progress  PLAN:  PT FREQUENCY: 2x/week  PT DURATION: 8-12 weeks  PLANNED INTERVENTIONS: 97164- PT Re-evaluation, 97750- Physical Performance Testing, 97110-Therapeutic exercises, 97530- Therapeutic activity, [id number]- Neuromuscular re-education, 97535- Self Care, [id number]- Manual therapy, [id number]- Gait training, [phone] (1-2 muscles), 20561 (3+ muscles)- Dry Needling, Patient/Family education, Balance training, Cryotherapy, and Moist heat  PLAN FOR NEXT SESSION:  Continue working on single leg balance, proprioception, regain ROM, improve strength, endurance and motor control. Update HEP as appropriate. Consider adding airex foam with ball throws.   Dorina HERO. Fairly IV, PT, DPT Physical Therapist- [place]  [facility]    Vernell Rise, [facility]  [facility] [address] [place], [place] [address] P: [phone] I F: [phone]

## 2024-01-11 ENCOUNTER — Ambulatory Visit

## 2024-01-11 DIAGNOSIS — M25672 Stiffness of left ankle, not elsewhere classified: Secondary | ICD-10-CM

## 2024-01-11 DIAGNOSIS — M25572 Pain in left ankle and joints of left foot: Secondary | ICD-10-CM | POA: Diagnosis not present

## 2024-01-11 DIAGNOSIS — M25675 Stiffness of left foot, not elsewhere classified: Secondary | ICD-10-CM

## 2024-01-11 DIAGNOSIS — R262 Difficulty in walking, not elsewhere classified: Secondary | ICD-10-CM

## 2024-01-12 NOTE — Therapy (Unsigned)
 OUTPATIENT PHYSICAL THERAPY TREATMENT   Patient Name: Phillip Stephenson MRN: [id number] DOB:[birth year], 43 y.o., male Today's Date: 01/13/2024  END OF SESSION:  PT End of Session - 01/13/24 0859     Visit Number 6    Number of Visits 17    Date for Recertification  03/09/24    Authorization Type [place] [employer]ICA[employer] reporting period from 12/16/23    Authorization Time Period HB auth# [id number] for 13 PT vsts from 10/20-1/20/26    Authorization - Number of Visits 13    Progress Note Due on Visit 10    PT Start Time 0900    PT Stop Time 0943    PT Time Calculation (min) 43 min    Activity Tolerance Patient tolerated treatment well    Behavior During Therapy [facility] for tasks assessed/performed         Past Medical History:  Diagnosis Date   Acute metabolic acidosis 09/2023   likely from alcoholic ketoacidosis.   Elevated BP without diagnosis of hypertension 2025   Elevated liver enzymes 09/2023   ETOH abuse    STD (male) 2024   Substance abuse (HCC)    Trimalleolar fracture of ankle, closed, left, initial encounter 09/2023   Past Surgical History:  Procedure Laterality Date   HERNIA REPAIR     ORIF ANKLE FRACTURE Left 10/02/2023   Procedure: OPEN REDUCTION INTERNAL FIXATION (ORIF) ANKLE FRACTURE;  Surgeon: Janit Thresa HERO, DPM;  Location: [facility] ORS;  Service: Orthopedics/Podiatry;  Laterality: Left;   There are no active problems to display for this patient.  PCP: no PCP per patient  REFERRING PROVIDER: Janit Thresa HERO, DPM  REFERRING DIAG: trimalleolar fracture of ankle, closed, left, with routine healing  THERAPY DIAG:  Pain in left ankle and joints of left foot  Stiffness of left foot, not elsewhere classified  Difficulty in walking, not elsewhere classified  Stiffness of left ankle, not elsewhere classified  Muscle weakness (generalized)  Rationale for Evaluation and Treatment: Rehabilitation  ONSET DATE: fracture 09/19/2023, ORIF 10/02/2023  SUBJECTIVE:    PERTINENT HISTORY: Patient is a 43 y.o. male who presents to outpatient physical therapy with a referral for medical diagnosis trimalleolar fracture of ankle, closed, left, with routine healing. This patient's chief complaints consist of left ankle pain, stiffness, swelling, and weakness s/p L ankle ORIF 8/1/205 2/2 comminuted L ankle fracture after coming down wrong while playing basketball on 09/19/23, leading to the following functional deficits:  difficulty with working, working out, playing with son, walking, standing, running, sleeping, being up for more than 2-3 hours, wearing shoes for more than 2-3 hours. Relevant past medical history and comorbidities include the following: he  has a past medical history of Acute metabolic acidosis (09/2023), Elevated BP without diagnosis of hypertension (2025), Elevated liver enzymes (09/2023), ETOH abuse, STD (male) (2024), Substance abuse (HCC), and Trimalleolar fracture of ankle, closed, left, initial encounter (09/2023). he  has a past surgical history that includes Hernia repair and ORIF ankle fracture (Left, 10/02/2023). Patient denies hx of cancer, stroke, seizures, lung problems, heart problems, diabetes, unexplained weight loss, unexplained changes in bowel or bladder problems, unexplained stumbling or dropping things, osteoporosis, and spinal surgery  Exercise history: works out (lifting and treadmill)  SUBJECTIVE STATEMENT: Felt good after our session on Tuesday, noticed some stiffness but thinks it was due to the cold. Has been keeping the heat on in the car to come out by his feet and noted that has been helping it.  PAIN: NPRS:  Current: no pain, just light soreness of anterior ankle and out toward lateral malleoli  From initial PT evaluation on 12/16/23:  NPRS: Best: 2/10, Worst: 6/10. Pain location: medial and lateral Left ankle, a little over the top of the left foot Pain description: achy, sharp pains here and there. Numbness/tingling:  denies Aggravating factors: doing too much for too long, going too much of a distance Relieving factors: laying down and elevating, ibuprofen    PRECAUTIONS: None  WEIGHT BEARING RESTRICTIONS: No  FALLS:  Has patient fallen in last 6 months? Yes. Number of falls when he broke his ankle while playing ball   PLOF: no limitations due to L ankle  PATIENT GOALS: To get back walking normal without a limp  OBJECTIVE                                                                                              TREATMENT Therapeutic exercise: therapeutic exercises that incorporate ONE parameter at one or more areas of the body to centralize symptoms, develop strength and endurance, range of motion, and flexibility required for successful completion of functional activities.  Recumbent bike for improved lower extremity ROM, muscular endurance, and activity tolerance; and to induce the analgesic effect of aerobic exercise, stimulate joint nutrition, and prepare body structures and systems for following interventions.  Seat setting: 16 6  minutes Level: 2 Target RPM: 75 RPM: 74 RPE: 3/10  Seated figure 4 L ankle inversion against therband looped around balls of both feet  2 x 30 with RTB   Standing Bilateral gastroc stretch 2 x 30 seconds  Standing L soleus stretch/ankle DF self mob  2 x 5 with 10 second holds  Seated L ankle/toes PF stretch with top of foot on floor 2 x 5 at 10 seconds  Therapeutic activities: dynamic therapeutic activities incorporating MULTIPLE parameters or areas of the body designed to achieve improved functional performance.  Lateral stepping with theraband wrapped under forefeet and crossed in front of body, keeping feet at least one foot width apart, to improve functional hip and ankle eversion for ambulatory activities.  3 x 15 steps each direction with GTB  Standing bilateral heel raises with UE support on TM as needed 2 x 10 up with both heels , weight  shift to L and down on L leg Pt cued to come down on L foot only   Standing eccentric squat focusing on LE alignment with heels elevated on slant board and functional stretch of L ankle 2 x 5 with 5 second hold with heels elevated on slant board       Soleus stretch/Ankle DF self mob on step     2 x 5 5 second hold    Pt felt stretch in posterior lateral heel as well as pressure in anterior ankle  Neuromuscular Re-education: a technique or exercise performed with the goal of improving the level of communication between the body and the brain, such as for balance, motor control, muscle activation patterns, coordination, desensitization, quality of muscle contraction, proprioception, and/or kinesthetic sense needed for successful and safe completion of functional activities.   Balance  board lateral static holds  3 x 30 sec hold  Balance board anterior and posterior tilts  3 x 10  Cued patient to stop right before the board hits the floor    Standing ball toss on foam pad    5 x 10 with 2kg ball    Cued patient to stand with feet apart and slight knee bend  Pt required multimodal cuing for proper technique and to facilitate improved neuromuscular control, strength, range of motion, and functional ability resulting in improved performance and form.  PATIENT EDUCATION:  Education details: Exercise purpose/form. Self management techniques. Education on diagnosis, prognosis, POC, anatomy and physiology of current condition. Education on HEP including handout. Person educated: Patient Education method: Explanation, Demonstration, Verbal cues, and Handouts Education comprehension: verbalized understanding, returned demonstration, and needs further education  HOME EXERCISE PROGRAM: Access Code: [email or web address] URL: [email or web address] Date: 01/13/2024 Prepared by: Vernell Rise  Exercises - Gastroc Stretch on Wall  - 1 x daily - 2 sets - 1 reps - 30 seconds hold - Soleus  Stretch on Wall  - 1 x daily - 2 sets - 10 reps - 10seconds hold - Eccentric Squat  - 1 x daily - 2 sets - 5 reps - 10 seconds time to lower - Seated Anterior Tibialis Stretch  - 1-2 x daily - 5-10 reps - 10 seconds hold - Seated Figure 4 Ankle Inversion with Resistance  - 1 x daily - 2-3 sets - 20-30 reps - Side stepping with band  - 1 x daily - 2-3 sets - 10- 20 reps - Standing Heel Raise  - 1 x daily - 3 sets - 15 reps - Standing Single Leg Stance with Counter Support  - 1-2 x daily - 3 sets - 30 hold - Standing 3-Way Kick  - 2 x daily - 3 sets - 10 reps - Standing Ankle Dorsiflexion Stretch on Chair  - 2 x daily - 2 sets - 5 reps - 5 second hold  ASSESSMENT:  CLINICAL IMPRESSION: Patient is able to self cue for knees over toes during heel raised squats. Completed balance board activities to improve balance with perturbations. Patient noted the anterior/posterior tilts were more difficult. Patient noted increase is soreness after completion of session today but stated it felt like he got a good workout. Patient will benefit from skilled physical therapy intervention to address current body structure impairments and activity limitations to improve function and work towards goals set in current POC in order to return to prior level of function or maximal functional improvement.   From initial PT evaluation on 12/16/23:  Patient is a 43 y.o. male referred to outpatient physical therapy with a medical diagnosis of trimalleolar fracture of ankle, closed, left, with routine healing, who presents with signs and symptoms consistent with left ankle stiffness, pain, swelling, and weakness following ORIF on 10/02/23. 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 Patient will benefit from skilled physical therapy intervention to address current body structure impairments and activity limitations to improve function and work towards goals set in current POC in order to return to prior level of function or maximal functional improvement.   OBJECTIVE IMPAIRMENTS: Abnormal gait, decreased activity  tolerance, decreased balance, decreased endurance, decreased knowledge of condition, decreased mobility, difficulty walking, decreased ROM, decreased strength, hypomobility, increased edema, impaired flexibility, improper body mechanics, and pain.   ACTIVITY LIMITATIONS: carrying, standing, squatting, sleeping, stairs, transfers, bed mobility, and locomotion level  PARTICIPATION LIMITATIONS: interpersonal relationship, shopping, community activity, occupation, yard work, and difficulty with working, playing basketball, working out, playing with son, walking, standing, running, sleeping, being up for more than 2-3 hours, wearing shoes for more than 2-3 hours  PERSONAL FACTORS: Past/current experiences, Time since onset of injury/illness/exacerbation, and 3+ comorbidities: as a past medical history of Acute metabolic acidosis (09/2023), Elevated BP without diagnosis of hypertension (2025), Elevated liver enzymes (09/2023), ETOH abuse, STD (male) (2024), Substance abuse (HCC), and Trimalleolar fracture of ankle, closed, left, initial encounter (09/2023). he  has a past surgical history that includes Hernia repair and ORIF ankle fracture (Left, 10/02/2023) are also affecting patient's functional outcome.   REHAB POTENTIAL: Good  CLINICAL DECISION MAKING: Stable/uncomplicated  EVALUATION COMPLEXITY: Low  GOALS: Goals reviewed with patient? No  SHORT TERM GOALS: Target date: 12/30/2023  Patient will be independent  with initial home exercise program for self-management of symptoms. Baseline: Initial HEP provided at IE (12/16/23); Goal status: In progress   LONG TERM GOALS: Target date: 03/09/2024  Patient will be independent with a long-term home exercise program for self-management of symptoms.  Baseline: Initial HEP provided at IE (12/16/23); Goal status: In progress  2.  Patient will demonstrate improved Lower Extremity Functional Scale (LEFS) to equal or greater than 64/80 to demonstrate improvement in overall condition and self-reported functional ability.  Baseline: 61/80 (12/16/23); Goal status: In progress  3.  Patient will demonstrate L ankle and great toe PROM equal to R or at least at normal without increase in pain beyond intermittant end range discomfort to improve his ability to walk, run, and work.  Baseline: limited and painful - see objective  (12/16/23); Goal status: In progress  4.  Patient will demonstrate the ability to complete 10 single leg heel raises on the L LE to demonstrate improved strength for walking, working, and running.  Baseline: unable to get one rep (12/16/23); Goal status: In progress  5.  Patient will demonstrate improvement in Patient Specific Functional Scale (PSFS) of equal or greater than 8/10 points to reflect clinically significant improvement in patient's most valued functional activities. Baseline: to be measured at visit 2 as appropriate (12/16/23); 2.7/10 (12/23/23);  Goal status: In progress  6.  Patient will report NPRS equal or less than 3/10 during functional activities during the last 2 weeks to improve their abilitly to complete community, work and/or recreational activities with less limitation. Baseline: 6/10 (12/16/23); Goal status: In progress  PLAN:  PT FREQUENCY: 2x/week  PT DURATION: 8-12 weeks  PLANNED INTERVENTIONS: 97164- PT Re-evaluation, 97750- Physical Performance Testing, 97110-Therapeutic exercises, 97530- Therapeutic  activity, [id number]- Neuromuscular re-education, 97535- Self Care, 02859- Manual therapy, 97116- Gait training, [id number] (1-2 muscles), 20561 (3+ muscles)- Dry Needling, Patient/Family education, Balance training, Cryotherapy, and Moist heat  PLAN FOR NEXT SESSION:  Continue working on single leg balance, balance board activities, proprioception, regain ROM, improve strength, endurance and motor control. Consider adding more fast paced lateral shuffles without resistance. Update HEP as appropriate. Manual therapy as needed.  Dorina HERO. Fairly IV, PT, DPT Physical Therapist- [place]  [facility]   Vernell Rise, [facility]  [facility] [address] [place], [place] [address] P: [phone] I F: [phone]

## 2024-01-13 ENCOUNTER — Ambulatory Visit

## 2024-01-13 DIAGNOSIS — M25572 Pain in left ankle and joints of left foot: Secondary | ICD-10-CM

## 2024-01-13 DIAGNOSIS — M25672 Stiffness of left ankle, not elsewhere classified: Secondary | ICD-10-CM

## 2024-01-13 DIAGNOSIS — M25675 Stiffness of left foot, not elsewhere classified: Secondary | ICD-10-CM

## 2024-01-13 DIAGNOSIS — R262 Difficulty in walking, not elsewhere classified: Secondary | ICD-10-CM

## 2024-01-13 DIAGNOSIS — M6281 Muscle weakness (generalized): Secondary | ICD-10-CM

## 2024-01-14 NOTE — Therapy (Signed)
 OUTPATIENT PHYSICAL THERAPY TREATMENT   Patient Name: Phillip Stephenson MRN: [id number] DOB:1981/01/30, 43 y.o., male Today's Date: 01/18/2024  END OF SESSION:  PT End of Session - 01/18/24 0949     Visit Number 7    Number of Visits 17    Date for Recertification  03/09/24    Authorization Type [place] [employer]ICA[employer] BLUE reporting period from 12/16/23    Authorization Time Period HB auth# [id number] for 13 PT vsts from 10/20-1/20/26    Authorization - Visit Number 6    Authorization - Number of Visits 13    Progress Note Due on Visit 10    PT Start Time 0947    PT Stop Time 1030    PT Time Calculation (min) 43 min    Activity Tolerance Patient tolerated treatment well    Behavior During Therapy [facility] for tasks assessed/performed         Past Medical History:  Diagnosis Date   Acute metabolic acidosis 09/2023   likely from alcoholic ketoacidosis.   Elevated BP without diagnosis of hypertension 2025   Elevated liver enzymes 09/2023   ETOH abuse    STD (male) 2024   Substance abuse (HCC)    Trimalleolar fracture of ankle, closed, left, initial encounter 09/2023   Past Surgical History:  Procedure Laterality Date   HERNIA REPAIR     ORIF ANKLE FRACTURE Left 10/02/2023   Procedure: OPEN REDUCTION INTERNAL FIXATION (ORIF) ANKLE FRACTURE;  Surgeon: Janit Thresa HERO, DPM;  Location: [facility] ORS;  Service: Orthopedics/Podiatry;  Laterality: Left;   There are no active problems to display for this patient.  PCP: no PCP per patient  REFERRING PROVIDER: Janit Thresa HERO, DPM  REFERRING DIAG: trimalleolar fracture of ankle, closed, left, with routine healing  THERAPY DIAG:  Stiffness of left ankle, not elsewhere classified  Muscle weakness (generalized)  Stiffness of left foot, not elsewhere classified  Pain in left ankle and joints of left foot  Difficulty in walking, not elsewhere classified  Rationale for Evaluation and Treatment: Rehabilitation  ONSET DATE: fracture  09/19/2023, ORIF 10/02/2023  SUBJECTIVE:   PERTINENT HISTORY: Patient is a 43 y.o. male who presents to outpatient physical therapy with a referral for medical diagnosis trimalleolar fracture of ankle, closed, left, with routine healing. This patient's chief complaints consist of left ankle pain, stiffness, swelling, and weakness s/p L ankle ORIF 8/1/205 2/2 comminuted L ankle fracture after coming down wrong while playing basketball on 09/19/23, leading to the following functional deficits:  difficulty with working, working out, playing with son, walking, standing, running, sleeping, being up for more than 2-3 hours, wearing shoes for more than 2-3 hours. Relevant past medical history and comorbidities include the following: he  has a past medical history of Acute metabolic acidosis (09/2023), Elevated BP without diagnosis of hypertension (2025), Elevated liver enzymes (09/2023), ETOH abuse, STD (male) (2024), Substance abuse (HCC), and Trimalleolar fracture of ankle, closed, left, initial encounter (09/2023). he  has a past surgical history that includes Hernia repair and ORIF ankle fracture (Left, 10/02/2023). Patient denies hx of cancer, stroke, seizures, lung problems, heart problems, diabetes, unexplained weight loss, unexplained changes in bowel or bladder problems, unexplained stumbling or dropping things, osteoporosis, and spinal surgery  Exercise history: works out (lifting and treadmill)  SUBJECTIVE STATEMENT: Has been feeling good, was able to do a little more this weekend without additional soreness. HEP has been going good and he has noticed a reduction in ankle ankle stiffness. Patient reported pain in  posterior ankle when wearing his chuck taylors to walk around in.  PAIN: NPRS: Current: no pain just soreness noted on lateral malleolis  From initial PT evaluation on 12/16/23:  NPRS: Best: 2/10, Worst: 6/10. Pain location: medial and lateral Left ankle, a little over the top of the left  foot Pain description: achy, sharp pains here and there. Numbness/tingling: denies Aggravating factors: doing too much for too long, going too much of a distance Relieving factors: laying down and elevating, ibuprofen    PRECAUTIONS: None  WEIGHT BEARING RESTRICTIONS: No  FALLS:  Has patient fallen in last 6 months? Yes. Number of falls when he broke his ankle while playing ball   PLOF: no limitations due to L ankle  PATIENT GOALS: To get back walking normal without a limp  OBJECTIVE                                                                                              TREATMENT Therapeutic exercise: therapeutic exercises that incorporate ONE parameter at one or more areas of the body to centralize symptoms, develop strength and endurance, range of motion, and flexibility required for successful completion of functional activities.  Recumbent bike for improved lower extremity ROM, muscular endurance, and activity tolerance; and to induce the analgesic effect of aerobic exercise, stimulate joint nutrition, and prepare body structures and systems for following interventions.  Seat setting: 16 6  minutes Level: 3 Target RPM: 75 RPM: 72 RPE: 4/10  Seated L ankle/toes PF stretch with top of foot on floor 2 x 5 at 10 seconds  Therapeutic activities: dynamic therapeutic activities incorporating MULTIPLE parameters or areas of the body designed to achieve improved functional performance.  Lateral stepping with theraband wrapped under forefeet and crossed in front of body, keeping feet at least one foot width apart, to improve functional hip and ankle eversion for ambulatory activities.  3 x 15 steps each direction with GTB  Standing heel raises with UE support on TM as needed 1 x 10 up with both heels , weight shift to L and down on L leg 1 x 10 L heel raise only Pt cued to stand up straight when completing  Standing eccentric squat focusing on LE alignment with heels elevated  on slant board and functional stretch of L ankle 2 x 5 with 5 second hold with heels elevated on slant board  Patient noted pressure in the anterior L ankle with exercise      Soleus stretch/Ankle DF self mob on step     2 x 5  with 5 second hold Pt felt stretch in posterior lateral heel as well as pressure in anterior ankle  Neuromuscular Re-education: a technique or exercise performed with the goal of improving the level of communication between the body and the brain, such as for balance, motor control, muscle activation patterns, coordination, desensitization, quality of muscle contraction, proprioception, and/or kinesthetic sense needed for successful and safe completion of functional activities.   Balance board lateral tilts with UE assistance on TM  3 x 10  Cued patient to stop right before  the board hits the floor  Pt had some apprehension going toward L ankle  Balance board anterior and posterior tilts with UE assistance in TM  3 x 10  Cued patient to stop right before the board hits the floor    Standing ball toss on foam pad with varying toss placement    4 x 10 with 3kg ball double leg stance    Cued patient to stand with feet apart and slight knee bend  Pt required multimodal cuing for proper technique and to facilitate improved neuromuscular control, strength, range of motion, and functional ability resulting in improved performance and form.  PATIENT EDUCATION:  Education details: Exercise purpose/form. Self management techniques. Education on diagnosis, prognosis, POC, anatomy and physiology of current condition. Education on HEP including handout. Person educated: Patient Education method: Explanation, Demonstration, Verbal cues, and Handouts Education comprehension: verbalized understanding, returned demonstration, and needs further education  HOME EXERCISE PROGRAM: Access Code: [email or web address] URL: [email or web address] Date: 01/18/2024 Prepared by:  Vernell Rise  Exercises - Gastroc Stretch on Wall  - 1 x daily - 2 sets - 1 reps - 30 seconds hold - Soleus Stretch on Wall  - 1 x daily - 2 sets - 10 reps - 10seconds hold - Eccentric Squat  - 1 x daily - 2 sets - 5 reps - 10 seconds time to lower - Seated Anterior Tibialis Stretch  - 1-2 x daily - 5-10 reps - 10 seconds hold - Seated Figure 4 Ankle Inversion with Resistance  - 1 x daily - 2-3 sets - 20-30 reps - Side stepping with band  - 1 x daily - 2-3 sets - 10- 20 reps - Standing 3-Way Kick  - 2 x daily - 3 sets - 10 reps - Standing Ankle Dorsiflexion Stretch on Chair  - 2 x daily - 2 sets - 5 reps - 5 second hold - Single Leg Heel Raise with Chair Support  - 2 x daily - 3 sets - 10 reps  ASSESSMENT:  CLINICAL IMPRESSION: Patient noted increased difficulty completing anterior and posterior tilts on balance board compared to lateral shifts. Noted some apprehension completing lateral shifts to the L ankle. Noted pressure in front of L ankle during all balance board shift. Completed and varying toss placement to improve on balance with perturbations. Enjoys the ball toss as it feels like he is doing more sport specific activity. Patient noted increase soreness in L malleoli as well as anterior ankle at the completion of his session.Patient will benefit from skilled physical therapy intervention to address current body structure impairments and activity limitations to improve function and work towards goals set in current POC in order to return to prior level of function or maximal functional improvement.   From initial PT evaluation on 12/16/23:  Patient is a 43 y.o. male referred to outpatient physical therapy with a medical diagnosis of trimalleolar fracture of ankle, closed, left, with routine healing, who presents with signs and symptoms consistent with left ankle stiffness, pain, swelling, and weakness following ORIF on 10/02/23. Patient presents with significant pain, ROM, joint  stiffness, edema, motor control, knowledge, muscle performance (Strength/power/endurance), balance, proprioception, and activity tolerance impairments that are limiting ability to complete usual activities such as working, working out, scientist, physiological, playing with son, walking, standing, running, sleeping, being up for more than 2-3 hours, wearing shoes for more than 2-3 hours without difficulty. Patient will benefit from skilled physical therapy intervention to address current body structure impairments and activity limitations to improve function and work towards goals  set in current POC in order to return to prior level of function or maximal functional improvement.   OBJECTIVE IMPAIRMENTS: Abnormal gait, decreased activity tolerance, decreased balance, decreased endurance, decreased knowledge of condition, decreased mobility, difficulty walking, decreased ROM, decreased strength, hypomobility, increased edema, impaired flexibility, improper body mechanics, and pain.   ACTIVITY LIMITATIONS: carrying, standing, squatting, sleeping, stairs, transfers, bed mobility, and locomotion level  PARTICIPATION LIMITATIONS: interpersonal relationship, shopping, community activity, occupation, yard work, and difficulty with working, playing basketball, working out, playing with son, walking, standing, running, sleeping, being up for more than 2-3 hours, wearing shoes for more than 2-3 hours  PERSONAL FACTORS: Past/current experiences, Time since onset of injury/illness/exacerbation, and 3+ comorbidities: as a past medical history of Acute metabolic acidosis (09/2023), Elevated BP without diagnosis of hypertension (2025), Elevated liver enzymes (09/2023), ETOH abuse, STD (male) (2024), Substance abuse (HCC), and Trimalleolar fracture of ankle, closed, left, initial encounter (09/2023). he  has a past surgical history that includes Hernia repair and ORIF ankle fracture (Left, 10/02/2023) are also affecting patient's  functional outcome.   REHAB POTENTIAL: Good  CLINICAL DECISION MAKING: Stable/uncomplicated  EVALUATION COMPLEXITY: Low  GOALS: Goals reviewed with patient? No  SHORT TERM GOALS: Target date: 12/30/2023  Patient will be independent with initial home exercise program for self-management of symptoms. Baseline: Initial HEP provided at IE (12/16/23); Goal status: In progress  LONG TERM GOALS: Target date: 03/09/2024  Patient will be independent with a long-term home exercise program for self-management of symptoms.  Baseline: Initial HEP provided at IE (12/16/23); Goal status: In progress  2.  Patient will demonstrate improved Lower Extremity Functional Scale (LEFS) to equal or greater than 64/80 to demonstrate improvement in overall condition and self-reported functional ability.  Baseline: 61/80 (12/16/23); Goal status: In progress  3.  Patient will demonstrate L ankle and great toe PROM equal to R or at least at normal without increase in pain beyond intermittant end range discomfort to improve his ability to walk, run, and work.  Baseline: limited and painful - see objective  (12/16/23); Goal status: In progress  4.  Patient will demonstrate the ability to complete 10 single leg heel raises on the L LE to demonstrate improved strength for walking, working, and running.  Baseline: unable to get one rep (12/16/23); Goal status: In progress  5.  Patient will demonstrate improvement in Patient Specific Functional Scale (PSFS) of equal or greater than 8/10 points to reflect clinically significant improvement in patient's most valued functional activities. Baseline: to be measured at visit 2 as appropriate (12/16/23); 2.7/10 (12/23/23);  Goal status: In progress  6.  Patient will report NPRS equal or less than 3/10 during functional activities during the last 2 weeks to improve their abilitly to complete community, work and/or recreational activities with less limitation. Baseline:  6/10 (12/16/23); Goal status: In progress  PLAN:  PT FREQUENCY: 2x/week  PT DURATION: 8-12 weeks  PLANNED INTERVENTIONS: 97164- PT Re-evaluation, 97750- Physical Performance Testing, 97110-Therapeutic exercises, 97530- Therapeutic activity, [id number]- Neuromuscular re-education, 97535- Self Care, [id number]- Manual therapy, 97116- Gait training, [phone] (1-2 muscles), 20561 (3+ muscles)- Dry Needling, Patient/Family education, Balance training, Cryotherapy, and Moist heat  PLAN FOR NEXT SESSION:  Continue working on single leg balance, balance board activities, proprioception, regain ROM, improve strength, endurance and motor control. Consider adding more fast paced lateral shuffles without resistance and  . Update HEP as appropriate. Manual therapy as needed.  Vernell Rise, SPT  Camie SAUNDERS. Juli, PT, DPT, Cert. MDT 01/18/24, 10:51  AM  [facility] [address] [place], [place] [address] P: [phone] LILLETTE FALCON: [phone]   [facility] [address] [place], [place] [address] P: [phone] I F: [phone]

## 2024-01-18 ENCOUNTER — Ambulatory Visit: Admitting: Physical Therapy

## 2024-01-18 ENCOUNTER — Encounter: Payer: Self-pay | Admitting: Physical Therapy

## 2024-01-18 DIAGNOSIS — M6281 Muscle weakness (generalized): Secondary | ICD-10-CM

## 2024-01-18 DIAGNOSIS — M25675 Stiffness of left foot, not elsewhere classified: Secondary | ICD-10-CM

## 2024-01-18 DIAGNOSIS — M25572 Pain in left ankle and joints of left foot: Secondary | ICD-10-CM | POA: Diagnosis not present

## 2024-01-18 DIAGNOSIS — R262 Difficulty in walking, not elsewhere classified: Secondary | ICD-10-CM

## 2024-01-18 DIAGNOSIS — M25672 Stiffness of left ankle, not elsewhere classified: Secondary | ICD-10-CM

## 2024-01-20 ENCOUNTER — Ambulatory Visit: Admitting: Physical Therapy

## 2024-01-20 ENCOUNTER — Encounter: Payer: Self-pay | Admitting: Physical Therapy

## 2024-01-20 DIAGNOSIS — M25675 Stiffness of left foot, not elsewhere classified: Secondary | ICD-10-CM

## 2024-01-20 DIAGNOSIS — R262 Difficulty in walking, not elsewhere classified: Secondary | ICD-10-CM

## 2024-01-20 DIAGNOSIS — M25572 Pain in left ankle and joints of left foot: Secondary | ICD-10-CM

## 2024-01-20 DIAGNOSIS — M6281 Muscle weakness (generalized): Secondary | ICD-10-CM

## 2024-01-20 DIAGNOSIS — M25672 Stiffness of left ankle, not elsewhere classified: Secondary | ICD-10-CM

## 2024-01-20 NOTE — Therapy (Signed)
 OUTPATIENT PHYSICAL THERAPY TREATMENT   Patient Name: Phillip Stephenson MRN: [id number] DOB:11-22-1980, 43 y.o., male Today's Date: 01/20/2024  END OF SESSION:  PT End of Session - 01/20/24 1030     Visit Number 8    Number of Visits 17    Date for Recertification  03/09/24    Authorization Type [place] MEDICAID [employer] BLUE reporting period from 12/16/23    Authorization Time Period HB auth# [id number] for 13 PT vsts from 10/20-1/20/26    Authorization - Visit Number 7    Authorization - Number of Visits 13    Progress Note Due on Visit 10    PT Start Time 1031    PT Stop Time 1115    PT Time Calculation (min) 44 min    Activity Tolerance Patient tolerated treatment well    Behavior During Therapy [facility] for tasks assessed/performed          Past Medical History:  Diagnosis Date   Acute metabolic acidosis 09/2023   likely from alcoholic ketoacidosis.   Elevated BP without diagnosis of hypertension 2025   Elevated liver enzymes 09/2023   ETOH abuse    STD (male) 2024   Substance abuse (HCC)    Trimalleolar fracture of ankle, closed, left, initial encounter 09/2023   Past Surgical History:  Procedure Laterality Date   HERNIA REPAIR     ORIF ANKLE FRACTURE Left 10/02/2023   Procedure: OPEN REDUCTION INTERNAL FIXATION (ORIF) ANKLE FRACTURE;  Surgeon: Janit Thresa HERO, DPM;  Location: [facility] ORS;  Service: Orthopedics/Podiatry;  Laterality: Left;   There are no active problems to display for this patient.  PCP: no PCP per patient  REFERRING PROVIDER: Janit Thresa HERO, DPM  REFERRING DIAG: trimalleolar fracture of ankle, closed, left, with routine healing  THERAPY DIAG:  Stiffness of left ankle, not elsewhere classified  Difficulty in walking, not elsewhere classified  Pain in left ankle and joints of left foot  Stiffness of left foot, not elsewhere classified  Muscle weakness (generalized)  Rationale for Evaluation and Treatment: Rehabilitation  ONSET DATE: fracture  09/19/2023, ORIF 10/02/2023  SUBJECTIVE:   PERTINENT HISTORY: Patient is a 43 y.o. male who presents to outpatient physical therapy with a referral for medical diagnosis trimalleolar fracture of ankle, closed, left, with routine healing. This patient's chief complaints consist of left ankle pain, stiffness, swelling, and weakness s/p L ankle ORIF 8/1/205 2/2 comminuted L ankle fracture after coming down wrong while playing basketball on 09/19/23, leading to the following functional deficits:  difficulty with working, working out, playing with son, walking, standing, running, sleeping, being up for more than 2-3 hours, wearing shoes for more than 2-3 hours. Relevant past medical history and comorbidities include the following: he  has a past medical history of Acute metabolic acidosis (09/2023), Elevated BP without diagnosis of hypertension (2025), Elevated liver enzymes (09/2023), ETOH abuse, STD (male) (2024), Substance abuse (HCC), and Trimalleolar fracture of ankle, closed, left, initial encounter (09/2023). he  has a past surgical history that includes Hernia repair and ORIF ankle fracture (Left, 10/02/2023). Patient denies hx of cancer, stroke, seizures, lung problems, heart problems, diabetes, unexplained weight loss, unexplained changes in bowel or bladder problems, unexplained stumbling or dropping things, osteoporosis, and spinal surgery  Exercise history: works out (lifting and treadmill)  SUBJECTIVE STATEMENT: Patient reported some soreness after last session around the malleoli but it did not swell. Noticed that If he steps on uneven ground it hurts on his lateral ankle a bit. Has not done  much this morning and noticed his ankle was more stiff than normal when walking into PT.  PAIN: NPRS: Current: pt reports soreness in anterior ankle as well as malleoli this morning.  From initial PT evaluation on 12/16/23:  NPRS: Best: 2/10, Worst: 6/10. Pain location: medial and lateral Left ankle, a  little over the top of the left foot Pain description: achy, sharp pains here and there. Numbness/tingling: denies Aggravating factors: doing too much for too long, going too much of a distance Relieving factors: laying down and elevating, ibuprofen    PRECAUTIONS: None  WEIGHT BEARING RESTRICTIONS: No  FALLS:  Has patient fallen in last 6 months? Yes. Number of falls when he broke his ankle while playing ball  PLOF: no limitations due to L ankle  PATIENT GOALS: To get back walking normal without a limp  OBJECTIVE                                                                                              TREATMENT Therapeutic exercise: therapeutic exercises that incorporate ONE parameter at one or more areas of the body to centralize symptoms, develop strength and endurance, range of motion, and flexibility required for successful completion of functional activities.  Recumbent bike for improved lower extremity ROM, muscular endurance, and activity tolerance; and to induce the analgesic effect of aerobic exercise, stimulate joint nutrition, and prepare body structures and systems for following interventions.  Seat setting: 16 6  minutes Level: 3 Target RPM: 75 RPM: 68  RPE: 3/10   Seated L ankle/toes PF stretch with top of foot on floor to decrease anterior ankle stiffness 2 x 5 at 10 seconds  Therapeutic activities: dynamic therapeutic activities incorporating MULTIPLE parameters or areas of the body designed to achieve improved functional performance.  Lateral stepping with theraband wrapped under forefeet and crossed in front of body, keeping feet at least one foot width apart, to improve functional hip and ankle eversion for ambulatory activities.  3 x 15 steps each direction with GTB  Standing heel raises with UE support on TM as needed to improve L LE strength and single leg stance stability for ambulation 2 x 10 L heel raise only Pt cued to stand up straight when  completing  Standing eccentric squat focusing on LE alignment with heels elevated on slant board for functional stretch of L ankle and to improve LE strength 2 x 5 with 5 second hold with heels elevated on slant board  Patient noted pressure in the anterior L ankle with exercise  Soleus stretch/Ankle DF self mob on step to reduce calf tightness as well as increase DF ROM for improved step length during walking    2 x 5  with 5 second hold Pt felt stretch in posterior lateral heel as well as pressure in anterior ankle  Neuromuscular Re-education: a technique or exercise performed with the goal of improving the level of communication between the body and the brain, such as for balance, motor control, muscle activation patterns, coordination, desensitization, quality of muscle contraction, proprioception, and/or kinesthetic sense needed for successful and safe completion of functional activities.  Balance board lateral tilts with occasional UE assistance on TM to improve lateral motor control of ankle for improved stability  3 x 10  Cued patient to stand with feet near end of  Pt had some difficulty going to L ankle and noted some challenge goig to the R due to positioning of L ankle.  Balance board anterior and posterior tilts with occasional UE assistance in TM improve anterior and posterior motor control of ankle for improved stability  3 x 10  Cued patient to stop right before the board hits the floor  Standing ball toss on foam pad with varying toss placement to improve single leg stance balance and motor control with perturbations 4 x 10 with 2kg ball double leg stance toss, single leg stance catch  Pt required multimodal cuing for proper technique and to facilitate improved neuromuscular control, strength, range of motion, and functional ability resulting in improved performance and form.  PATIENT EDUCATION:  Education details: Exercise purpose/form. Self management techniques.  Education on diagnosis, prognosis, POC, anatomy and physiology of current condition. Education on HEP including handout. Person educated: Patient Education method: Explanation, Demonstration, Verbal cues, and Handouts Education comprehension: verbalized understanding, returned demonstration, and needs further education  HOME EXERCISE PROGRAM: Access Code: [email or web address] URL: [email or web address] Date: 01/18/2024 Prepared by: Vernell Rise  Exercises - Gastroc Stretch on Wall  - 1 x daily - 2 sets - 1 reps - 30 seconds hold - Soleus Stretch on Wall  - 1 x daily - 2 sets - 10 reps - 10seconds hold - Eccentric Squat  - 1 x daily - 2 sets - 5 reps - 10 seconds time to lower - Seated Anterior Tibialis Stretch  - 1-2 x daily - 5-10 reps - 10 seconds hold - Seated Figure 4 Ankle Inversion with Resistance  - 1 x daily - 2-3 sets - 20-30 reps - Side stepping with band  - 1 x daily - 2-3 sets - 10- 20 reps - Standing 3-Way Kick  - 2 x daily - 3 sets - 10 reps - Standing Ankle Dorsiflexion Stretch on Chair  - 2 x daily - 2 sets - 5 reps - 5 second hold - Single Leg Heel Raise with Chair Support  - 2 x daily - 3 sets - 10 reps  ASSESSMENT:  CLINICAL IMPRESSION: Patient stated some difficulty with anterior and posterior balance board tilts and felt some tension on lateral ankle. Patient reported that is the exact location he feels it when walking on an uneven surface. Patient had some difficulty going to L during lateral shifts due to unsteady feeling in L ankle and difficulty going to the R due to postioning of L ankle. Patient had increased anterior ankle stiffness with soleus stretch on step. Completed ball toss with double leg stance toss single leg stance catch to improve single leg balance with perturbations. Patient unable to complete toss and catch with single leg with and without foam at this time due to decrease single leg stance time with no UE assistance. Patient reported pain in  anterior ankle and maelloli at a 3-4/10 at completion of session. Heel raises were completed in standing to improve L LE strength as well as single leg stability for ambulation on uneven surfaces.Patient will benefit from skilled physical therapy intervention to address current body structure impairments and activity limitations to improve function and work towards goals set in current POC in order to return to prior level of function or maximal functional improvement.  From initial PT evaluation on 12/16/23:  Patient is a 43 y.o. male referred to outpatient physical therapy with a medical diagnosis of trimalleolar fracture of ankle, closed, left, with routine healing, who presents with signs and symptoms consistent with left ankle stiffness, pain, swelling, and weakness following ORIF on 10/02/23. Patient presents with significant pain, ROM, joint stiffness, edema, motor control, knowledge, muscle performance (Strength/power/endurance), balance, proprioception, and activity tolerance impairments that are limiting ability to complete usual activities such as working, working out, scientist, physiological, playing with son, walking, standing, running, sleeping, being up for more than 2-3 hours, wearing shoes for more than 2-3 hours without difficulty. Patient will benefit from skilled physical therapy intervention to address current body structure impairments and activity limitations to improve function and work towards goals set in current POC in order to return to prior level of function or maximal functional improvement.   OBJECTIVE IMPAIRMENTS: Abnormal gait, decreased activity tolerance, decreased balance, decreased endurance, decreased knowledge of condition, decreased mobility, difficulty walking, decreased ROM, decreased strength, hypomobility, increased edema, impaired flexibility, improper body mechanics, and pain.   ACTIVITY LIMITATIONS: carrying, standing, squatting, sleeping, stairs, transfers, bed  mobility, and locomotion level  PARTICIPATION LIMITATIONS: interpersonal relationship, shopping, community activity, occupation, yard work, and difficulty with working, playing basketball, working out, playing with son, walking, standing, running, sleeping, being up for more than 2-3 hours, wearing shoes for more than 2-3 hours  PERSONAL FACTORS: Past/current experiences, Time since onset of injury/illness/exacerbation, and 3+ comorbidities: as a past medical history of Acute metabolic acidosis (09/2023), Elevated BP without diagnosis of hypertension (2025), Elevated liver enzymes (09/2023), ETOH abuse, STD (male) (2024), Substance abuse (HCC), and Trimalleolar fracture of ankle, closed, left, initial encounter (09/2023). he  has a past surgical history that includes Hernia repair and ORIF ankle fracture (Left, 10/02/2023) are also affecting patient's functional outcome.   REHAB POTENTIAL: Good  CLINICAL DECISION MAKING: Stable/uncomplicated  EVALUATION COMPLEXITY: Low  GOALS: Goals reviewed with patient? No  SHORT TERM GOALS: Target date: 12/30/2023  Patient will be independent with initial home exercise program for self-management of symptoms. Baseline: Initial HEP provided at IE (12/16/23); Goal status: In progress  LONG TERM GOALS: Target date: 03/09/2024  Patient will be independent with a long-term home exercise program for self-management of symptoms.  Baseline: Initial HEP provided at IE (12/16/23); Goal status: In progress  2.  Patient will demonstrate improved Lower Extremity Functional Scale (LEFS) to equal or greater than 64/80 to demonstrate improvement in overall condition and self-reported functional ability.  Baseline: 61/80 (12/16/23); Goal status: In progress  3.  Patient will demonstrate L ankle and great toe PROM equal to R or at least at normal without increase in pain beyond intermittant end range discomfort to improve his ability to walk, run, and work.  Baseline:  limited and painful - see objective  (12/16/23); Goal status: In progress  4.  Patient will demonstrate the ability to complete 10 single leg heel raises on the L LE to demonstrate improved strength for walking, working, and running.  Baseline: unable to get one rep (12/16/23); Goal status: In progress  5.  Patient will demonstrate improvement in Patient Specific Functional Scale (PSFS) of equal or greater than 8/10 points to reflect clinically significant improvement in patient's most valued functional activities. Baseline: to be measured at visit 2 as appropriate (12/16/23); 2.7/10 (12/23/23);  Goal status: In progress  6.  Patient will report NPRS equal or less than 3/10 during functional activities during the last 2  weeks to improve their abilitly to complete community, work and/or recreational activities with less limitation. Baseline: 6/10 (12/16/23); Goal status: In progress  PLAN:  PT FREQUENCY: 2x/week  PT DURATION: 8-12 weeks  PLANNED INTERVENTIONS: 97164- PT Re-evaluation, 97750- Physical Performance Testing, 97110-Therapeutic exercises, 97530- Therapeutic activity, [id number]- Neuromuscular re-education, 97535- Self Care, [id number]- Manual therapy, 97116- Gait training, [phone] (1-2 muscles), 20561 (3+ muscles)- Dry Needling, Patient/Family education, Balance training, Cryotherapy, and Moist heat  PLAN FOR NEXT SESSION: Continue working on single leg balance, balance board activities, proprioception, regain ROM, improve strength, endurance and motor control. Consider adding more fast paced lateral shuffles without resistance and return to 3 way touches in standing. Update HEP as appropriate. Manual therapy as needed.  Vernell Rise, SPT  Camie SAUNDERS. Juli, PT, DPT, Cert. MDT 01/20/24, 3:08 PM  [facility] [address] [place], [place] [address] P: [phone] I F: [phone]

## 2024-01-21 NOTE — Therapy (Signed)
 OUTPATIENT PHYSICAL THERAPY TREATMENT   Patient Name: Phillip Stephenson MRN: [id number] DOB:12/04/1980, 43 y.o., male Today's Date: 01/25/2024  END OF SESSION:  PT End of Session - 01/25/24 0947     Visit Number 9    Number of Visits 17    Date for Recertification  03/09/24    Authorization Type [place] [employer]ICA[employer] reporting period from 12/16/23    Authorization Time Period HB auth# [id number] for 13 PT vsts from 10/20-1/20/26    Authorization - Number of Visits 13    Progress Note Due on Visit 10    PT Start Time 0949    PT Stop Time 1028    PT Time Calculation (min) 39 min    Activity Tolerance Patient tolerated treatment well    Behavior During Therapy [facility] for tasks assessed/performed           Past Medical History:  Diagnosis Date   Acute metabolic acidosis 09/2023   likely from alcoholic ketoacidosis.   Elevated BP without diagnosis of hypertension 2025   Elevated liver enzymes 09/2023   ETOH abuse    STD (male) 2024   Substance abuse (HCC)    Trimalleolar fracture of ankle, closed, left, initial encounter 09/2023   Past Surgical History:  Procedure Laterality Date   HERNIA REPAIR     ORIF ANKLE FRACTURE Left 10/02/2023   Procedure: OPEN REDUCTION INTERNAL FIXATION (ORIF) ANKLE FRACTURE;  Surgeon: Janit Thresa HERO, DPM;  Location: [facility] ORS;  Service: Orthopedics/Podiatry;  Laterality: Left;   There are no active problems to display for this patient.  PCP: no PCP per patient  REFERRING PROVIDER: Janit Thresa HERO, DPM  REFERRING DIAG: trimalleolar fracture of ankle, closed, left, with routine healing  THERAPY DIAG:  Stiffness of left ankle, not elsewhere classified  Muscle weakness (generalized)  Difficulty in walking, not elsewhere classified  Pain in left ankle and joints of left foot  Stiffness of left foot, not elsewhere classified  Rationale for Evaluation and Treatment: Rehabilitation  ONSET DATE: fracture 09/19/2023, ORIF  10/02/2023  SUBJECTIVE:   PERTINENT HISTORY: Patient is a 43 y.o. male who presents to outpatient physical therapy with a referral for medical diagnosis trimalleolar fracture of ankle, closed, left, with routine healing. This patient's chief complaints consist of left ankle pain, stiffness, swelling, and weakness s/p L ankle ORIF 8/1/205 2/2 comminuted L ankle fracture after coming down wrong while playing basketball on 09/19/23, leading to the following functional deficits:  difficulty with working, working out, playing with son, walking, standing, running, sleeping, being up for more than 2-3 hours, wearing shoes for more than 2-3 hours. Relevant past medical history and comorbidities include the following: he  has a past medical history of Acute metabolic acidosis (09/2023), Elevated BP without diagnosis of hypertension (2025), Elevated liver enzymes (09/2023), ETOH abuse, STD (male) (2024), Substance abuse (HCC), and Trimalleolar fracture of ankle, closed, left, initial encounter (09/2023). he  has a past surgical history that includes Hernia repair and ORIF ankle fracture (Left, 10/02/2023). Patient denies hx of cancer, stroke, seizures, lung problems, heart problems, diabetes, unexplained weight loss, unexplained changes in bowel or bladder problems, unexplained stumbling or dropping things, osteoporosis, and spinal surgery  Exercise history: works out (lifting and treadmill)  SUBJECTIVE STATEMENT: Patient reported he was hurting a bit over the weekend but once he worked out it felt better. Patient reported stiffness in ankle improved as he got it moving Patient states he has not been swelling as bad and has noted  some soreness on palpation of anterior ankle and understands its from working it more.   PAIN: NPRS: Current: soreness/stiffness in anterior ankle and malleoli   From initial PT evaluation on 12/16/23:  NPRS: Best: 2/10, Worst: 6/10. Pain location: medial and lateral Left ankle, a little  over the top of the left foot Pain description: achy, sharp pains here and there. Numbness/tingling: denies Aggravating factors: doing too much for too long, going too much of a distance Relieving factors: laying down and elevating, ibuprofen    PRECAUTIONS: None  WEIGHT BEARING RESTRICTIONS: No  FALLS:  Has patient fallen in last 6 months? Yes. Number of falls when he broke his ankle while playing ball  PLOF: no limitations due to L ankle  PATIENT GOALS: To get back walking normal without a limp  OBJECTIVE                                                                                              TREATMENT Therapeutic exercise: therapeutic exercises that incorporate ONE parameter at one or more areas of the body to centralize symptoms, develop strength and endurance, range of motion, and flexibility required for successful completion of functional activities.  Recumbent bike for improved lower extremity ROM, muscular endurance, and activity tolerance; and to induce the analgesic effect of aerobic exercise, stimulate joint nutrition, and prepare body structures and systems for following interventions.  Seat setting: 16 6  minutes Level: 3 Target RPM: 70 RPM: 69 RPE: 4/10  Seated L ankle/toes PF stretch with top of foot on floor to decrease PF ankle stiffness 2 x 5 at 10 seconds  Therapeutic activities: dynamic therapeutic activities incorporating MULTIPLE parameters or areas of the body designed to achieve improved functional performance.  Lateral shuffles between cones placed 10' apart, keeping feet at least one foot width apart, to improve functional hip and ankle eversion for ambulatory activities.  2 x 2 shuffles (up and back = 1 ) Added to HEP  Standing heel raises with UE support on TM as needed to improve L LE strength and single leg stance stability for ambulation 2 x 10 L heel raise only Pt cued to stand up straight when completing  Standing eccentric squat focusing  on LE alignment with heels elevated on slant board for functional stretch of L ankle and to improve LE strength 2 x 5 with 5 second hold with heels elevated on slant board  Patient noted pressure in the anterior L ankle with exercise  Soleus stretch/Ankle DF self mob on step to reduce calf tightness as well as increase DF ROM for improved step length during walking    2 x 5  with 5 second hold Pt felt stretch in posterior lateral heel as well as pressure in anterior ankle   Neuromuscular Re-education: a technique or exercise performed with the goal of improving the level of communication between the body and the brain, such as for balance, motor control, muscle activation patterns, coordination, desensitization, quality of muscle contraction, proprioception, and/or kinesthetic sense needed for successful and safe completion of functional activities.   Standing Balance board lateral ankle tilts with occasional  UE assistance on TM to improve lateral motor control of ankle for improved stability 3 x 10 Pt had some difficulty going to L ankle and noted some challenge going to the R due to positioning of L ankle.  Standing Balance board anterior and posterior ankle  tilts with occasional UE assistance in TM improve anterior and posterior motor control of ankle for improved stability 3 x 10  Standing ball toss with varying toss placement to improve single leg stance balance and motor control with perturbations 1 x 10 with 2kg ball double leg stance toss, single leg stance catch on foam 3 x 10 with 2 kg ball single leg stance without foam pad  Standing single leg 3 way touches to improve single leg balance with pertubations 2 x 10   Pt required multimodal cuing for proper technique and to facilitate improved neuromuscular control, strength, range of motion, and functional ability resulting in improved performance and form.   PATIENT EDUCATION:  Education details: Exercise purpose/form. Self  management techniques. Education on diagnosis, prognosis, POC, anatomy and physiology of current condition. Education on HEP including handout. Person educated: Patient Education method: Explanation, Demonstration, Verbal cues, and Handouts Education comprehension: verbalized understanding, returned demonstration, and needs further education  HOME EXERCISE PROGRAM: Access Code: [email or web address] URL: [email or web address] Date: 01/25/2024 Prepared by: Vernell Rise  Exercises - Gastroc Stretch on Wall  - 1 x daily - 2 sets - 1 reps - 30 seconds hold - Soleus Stretch on Wall  - 1 x daily - 2 sets - 10 reps - 10seconds hold - Eccentric Squat  - 1 x daily - 2 sets - 5 reps - 10 seconds time to lower - Seated Anterior Tibialis Stretch  - 1-2 x daily - 5-10 reps - 10 seconds hold - Seated Figure 4 Ankle Inversion with Resistance  - 1 x daily - 2-3 sets - 20-30 reps - Standing 3-Way Kick  - 2 x daily - 3 sets - 10 reps - Standing Ankle Dorsiflexion Stretch on Chair  - 2 x daily - 2 sets - 5 reps - 5 second hold - Single Leg Heel Raise with Chair Support  - 2 x daily - 3 sets - 10 reps - Lateral Shuffles  - 2 x daily - 2 sets - 4 reps  ASSESSMENT:  CLINICAL IMPRESSION: Patient reported an improvement in ankle stiffness with warm up on the bike. Patient still reports more difficulty with lateral shifts to the L ankle but feels it is getting better. Completed single leg stance without foam pad to improve single leg stance with perturbations. Patient reported increased difficulty with single leg stance without foam pad compared to single leg catch on foam pad. Completed lateral shuffles at quicker pace to improve ankle eversion with faster paced ambulatory activities and return to basketball. Patient reported feeling muscle fatigue on the lateral left ankle. Patient reported pain/soreness upon completion of session at a 3-4/10 in the anterior ankle and malleoli. Patient will benefit from  skilled physical therapy intervention to address current body structure impairments and activity limitations to improve function and work towards goals set in current POC in order to return to prior level of function or maximal functional improvement.   From initial PT evaluation on 12/16/23:  Patient is a 43 y.o. male referred to outpatient physical therapy with a medical diagnosis of trimalleolar fracture of ankle, closed, left, with routine healing, who presents with signs and symptoms consistent with left ankle stiffness, pain, swelling, and weakness following  ORIF on 10/02/23. Patient presents with significant pain, ROM, joint stiffness, edema, motor control, knowledge, muscle performance (Strength/power/endurance), balance, proprioception, and activity tolerance impairments that are limiting ability to complete usual activities such as working, working out, scientist, physiological, playing with son, walking, standing, running, sleeping, being up for more than 2-3 hours, wearing shoes for more than 2-3 hours without difficulty. Patient will benefit from skilled physical therapy intervention to address current body structure impairments and activity limitations to improve function and work towards goals set in current POC in order to return to prior level of function or maximal functional improvement.   OBJECTIVE IMPAIRMENTS: Abnormal gait, decreased activity tolerance, decreased balance, decreased endurance, decreased knowledge of condition, decreased mobility, difficulty walking, decreased ROM, decreased strength, hypomobility, increased edema, impaired flexibility, improper body mechanics, and pain.   ACTIVITY LIMITATIONS: carrying, standing, squatting, sleeping, stairs, transfers, bed mobility, and locomotion level  PARTICIPATION LIMITATIONS: interpersonal relationship, shopping, community activity, occupation, yard work, and difficulty with working, playing basketball, working out, playing with son,  walking, standing, running, sleeping, being up for more than 2-3 hours, wearing shoes for more than 2-3 hours  PERSONAL FACTORS: Past/current experiences, Time since onset of injury/illness/exacerbation, and 3+ comorbidities: as a past medical history of Acute metabolic acidosis (09/2023), Elevated BP without diagnosis of hypertension (2025), Elevated liver enzymes (09/2023), ETOH abuse, STD (male) (2024), Substance abuse (HCC), and Trimalleolar fracture of ankle, closed, left, initial encounter (09/2023). he  has a past surgical history that includes Hernia repair and ORIF ankle fracture (Left, 10/02/2023) are also affecting patient's functional outcome.   REHAB POTENTIAL: Good  CLINICAL DECISION MAKING: Stable/uncomplicated  EVALUATION COMPLEXITY: Low  GOALS: Goals reviewed with patient? No  SHORT TERM GOALS: Target date: 12/30/2023  Patient will be independent with initial home exercise program for self-management of symptoms. Baseline: Initial HEP provided at IE (12/16/23); Goal status: In progress  LONG TERM GOALS: Target date: 03/09/2024  Patient will be independent with a long-term home exercise program for self-management of symptoms.  Baseline: Initial HEP provided at IE (12/16/23); Goal status: In progress  2.  Patient will demonstrate improved Lower Extremity Functional Scale (LEFS) to equal or greater than 64/80 to demonstrate improvement in overall condition and self-reported functional ability.  Baseline: 61/80 (12/16/23); Goal status: In progress  3.  Patient will demonstrate L ankle and great toe PROM equal to R or at least at normal without increase in pain beyond intermittant end range discomfort to improve his ability to walk, run, and work.  Baseline: limited and painful - see objective  (12/16/23); Goal status: In progress  4.  Patient will demonstrate the ability to complete 10 single leg heel raises on the L LE to demonstrate improved strength for walking, working,  and running.  Baseline: unable to get one rep (12/16/23); Goal status: In progress  5.  Patient will demonstrate improvement in Patient Specific Functional Scale (PSFS) of equal or greater than 8/10 points to reflect clinically significant improvement in patient's most valued functional activities. Baseline: to be measured at visit 2 as appropriate (12/16/23); 2.7/10 (12/23/23);  Goal status: In progress  6.  Patient will report NPRS equal or less than 3/10 during functional activities during the last 2 weeks to improve their abilitly to complete community, work and/or recreational activities with less limitation. Baseline: 6/10 (12/16/23); Goal status: In progress  PLAN:  PT FREQUENCY: 2x/week  PT DURATION: 8-12 weeks  PLANNED INTERVENTIONS: 97164- PT Re-evaluation, 97750- Physical Performance Testing, 97110-Therapeutic exercises, 97530- Therapeutic activity,  02887- Neuromuscular re-education, [phone]- Self Care, 02859- Manual therapy, [id number]- Gait training, [phone] (1-2 muscles), 20561 (3+ muscles)- Dry Needling, Patient/Family education, Balance training, Cryotherapy, and Moist heat  PLAN FOR NEXT SESSION: Reassess progress, continue working on single leg balance,faster paced lateral shuffles balance board activities, proprioception, regain ROM, improve strength, endurance and motor control. Consider adding light lateral and anterior/posterior hopping. Update HEP as appropriate. Manual therapy as needed.  Vernell Rise, SPT  Camie SAUNDERS. Juli, PT, DPT, Cert. MDT 01/25/24, 3:09 PM  [facility] [address] [place], [place] [address] P: [phone] I F: [phone]

## 2024-01-25 ENCOUNTER — Ambulatory Visit: Admitting: Physical Therapy

## 2024-01-25 ENCOUNTER — Encounter: Payer: Self-pay | Admitting: Physical Therapy

## 2024-01-25 DIAGNOSIS — M25675 Stiffness of left foot, not elsewhere classified: Secondary | ICD-10-CM

## 2024-01-25 DIAGNOSIS — R262 Difficulty in walking, not elsewhere classified: Secondary | ICD-10-CM

## 2024-01-25 DIAGNOSIS — M25572 Pain in left ankle and joints of left foot: Secondary | ICD-10-CM

## 2024-01-25 DIAGNOSIS — M25672 Stiffness of left ankle, not elsewhere classified: Secondary | ICD-10-CM

## 2024-01-25 DIAGNOSIS — M6281 Muscle weakness (generalized): Secondary | ICD-10-CM

## 2024-01-26 NOTE — Therapy (Unsigned)
 OUTPATIENT PHYSICAL THERAPY TREATMENT/PROGRESS NOTE Dates Reporting from 12/16/23 to 01/27/24   Patient Name: Phillip Stephenson MRN: [id number] DOB:May 01, 1980, 43 y.o., male Today's Date: 01/27/2024  END OF SESSION:  PT End of Session - 01/27/24 1351     Visit Number 10    Number of Visits 17    Date for Recertification  03/09/24    Authorization Type [place] [employer]ICA[employer]UE reporting period from 12/16/23    Authorization Time Period HB auth# [id number] for 13 PT vsts from 10/20-1/20/26    Authorization - Number of Visits 13    Progress Note Due on Visit 10    PT Start Time 1347    PT Stop Time 1426    PT Time Calculation (min) 39 min    Activity Tolerance Patient tolerated treatment well    Behavior During Therapy [facility] for tasks assessed/performed          Past Medical History:  Diagnosis Date   Acute metabolic acidosis 09/2023   likely from alcoholic ketoacidosis.   Elevated BP without diagnosis of hypertension 2025   Elevated liver enzymes 09/2023   ETOH abuse    STD (male) 2024   Substance abuse (HCC)    Trimalleolar fracture of ankle, closed, left, initial encounter 09/2023   Past Surgical History:  Procedure Laterality Date   HERNIA REPAIR     ORIF ANKLE FRACTURE Left 10/02/2023   Procedure: OPEN REDUCTION INTERNAL FIXATION (ORIF) ANKLE FRACTURE;  Surgeon: Janit Thresa HERO, DPM;  Location: [facility] ORS;  Service: Orthopedics/Podiatry;  Laterality: Left;   There are no active problems to display for this patient.  PCP: no PCP per patient  REFERRING PROVIDER: Janit Thresa HERO, DPM  REFERRING DIAG: trimalleolar fracture of ankle, closed, left, with routine healing  THERAPY DIAG:  Stiffness of left ankle, not elsewhere classified  Stiffness of left foot, not elsewhere classified  Muscle weakness (generalized)  Difficulty in walking, not elsewhere classified  Pain in left ankle and joints of left foot  Rationale for Evaluation and Treatment:  Rehabilitation  ONSET DATE: fracture 09/19/2023, ORIF 10/02/2023  SUBJECTIVE:   PERTINENT HISTORY: Patient is a 43 y.o. male who presents to outpatient physical therapy with a referral for medical diagnosis trimalleolar fracture of ankle, closed, left, with routine healing. This patient's chief complaints consist of left ankle pain, stiffness, swelling, and weakness s/p L ankle ORIF 8/1/205 2/2 comminuted L ankle fracture after coming down wrong while playing basketball on 09/19/23, leading to the following functional deficits:  difficulty with working, working out, playing with son, walking, standing, running, sleeping, being up for more than 2-3 hours, wearing shoes for more than 2-3 hours. Relevant past medical history and comorbidities include the following: he  has a past medical history of Acute metabolic acidosis (09/2023), Elevated BP without diagnosis of hypertension (2025), Elevated liver enzymes (09/2023), ETOH abuse, STD (male) (2024), Substance abuse (HCC), and Trimalleolar fracture of ankle, closed, left, initial encounter (09/2023). he  has a past surgical history that includes Hernia repair and ORIF ankle fracture (Left, 10/02/2023). Patient denies hx of cancer, stroke, seizures, lung problems, heart problems, diabetes, unexplained weight loss, unexplained changes in bowel or bladder problems, unexplained stumbling or dropping things, osteoporosis, and spinal surgery  Exercise history: works out (lifting and treadmill)  SUBJECTIVE STATEMENT: Patient stated he was sore after our session on Monday and thinks the shoes he wore yesterday made him more sore. Is going back to see the surgeon on 02/02/24 and is comfortable with a  potential DC day from PT on 02/03/24.  PAIN: NPRS: Current: 3/10 soreness in lateral ankle.  From initial eval: Are you having pain? Yes NPRS: Current: 3-4/10,  Best: 2/10, Worst: 6/10. Pain location: medial and lateral Left ankle, a little over the top of the left  foot Pain description: achy, sharp pains here and there. Numbness/tingling: denies Aggravating factors: doing too much for too long, going too much of a distance Relieving factors: laying down and elevating, ibuprofen    PRECAUTIONS: None  WEIGHT BEARING RESTRICTIONS: No  FALLS:  Has patient fallen in last 6 months? Yes. Number of falls when he broke his ankle while playing ball   PLOF: no limitations due to L ankle  PATIENT GOALS: To get back walking normal without a limp  OBJECTIVE  SELF-REPORTED FUNCTION  Lower Extremity Functional Scale Extreme difficulty/unable (0), Quite a bit of difficulty (1), Moderate difficulty (2), Little difficulty (3), No difficulty (4) Survey date:  12/16/23 01/27/24  Any of your usual work, housework or school activities 3 3  2. Usual hobbies, recreational or sporting activities 0 2  3. Getting into/out of the bath 3 4  4. Walking between rooms 4 4  5. Putting on socks/shoes 4 4  6. Squatting  4 4  7. Lifting an object, like a bag of groceries from the floor 4 3  8. Performing light activities around your home 4 3  9. Performing heavy activities around your home 2 2  10. Getting into/out of a car 4 4  11. Walking 2 blocks 3 2  12. Walking 1 mile 1 2  13. Going up/down 10 stairs (1 flight) 4 4  14. Standing for 1 hour 4 4  15.  sitting for 1 hour 4 3  16. Running on even ground 2 2  17. Running on uneven ground 2 2  18. Making sharp turns while running fast 2 2  19. Hopping  3 3  20. Rolling over in bed 4 4  Score total:  61/80 61/80   SELF-REPORTED FUNCTION 01/27/24 Patient Specific Functional Scale (PSFS)  Working: 3/10 Going to the gym: 7/10 Playing with son: 5/10 Average: 5/10  PERIPHERAL JOINT MOTION (in degrees)  ACTIVE RANGE OF MOTION (AROM) *Indicates pain 12/16/23 01/27/24  Joint/Motion R/L L  Ankle/Foot    Plantarflexion 31/25 35  Everison 30/18* 22*  Inversion 30/10 11  * denotes pain  PASSIVE RANGE OF MOTION  (PROM) *Indicates pain 12/16/23 01/27/24  Joint/Motion R/L R/L  Ankle/Foot    Dorsiflexion (knee ext, CKC) 20/8* 30/10*  Dorsiflexion (knee flex, CKC) 27/10* 34/18*  Great toe extension 70/50* 85/70*  * denotes pain  MUSCLE PERFORMANCE (MMT):  *Indicates pain 12/16/23 01/27/24  Joint/Motion R/L L  Ankle/Foot    Dorsiflexion (L4) 5/4* 4+*  Great toe extension (L5) 4+/4* 4*  Eversion (S1) 4/3+* 4-*  Plantarflexion (S1) 5/4* 4+*  Inversion 5/3+* 4-*     * denotes pain  TREATMENT Therapeutic exercise: therapeutic exercises that incorporate ONE parameter at one or more areas of the body to centralize symptoms, develop strength and endurance, range of motion, and flexibility required for successful completion of functional activities.   Measurements to assess progress (see above)  Recumbent bike for improved lower extremity ROM, muscular endurance, and activity tolerance; and to induce the analgesic effect of aerobic exercise, stimulate joint nutrition, and prepare body structures and systems for following interventions.  Seat setting: 16 6  minutes Level: 3 Target RPM: 65 RPM: 63 RPE: 4/10   Seated L ankle/toes PF stretch with top of foot on floor to decrease PF ankle stiffness 2 x 5 at 10 seconds   Therapeutic activities: dynamic therapeutic activities incorporating MULTIPLE parameters or areas of the body designed to achieve improved functional performance.  Soleus stretch/Ankle DF self mob on step to reduce calf tightness as well as increase DF ROM for improved step length during walking                              2 x 5  with 5 second hold Pt felt stretch in posterior lateral heel as well as pressure in anterior ankle   Pt required multimodal cuing for proper technique and to facilitate improved neuromuscular control, strength, range of motion, and functional ability resulting in  improved performance and form.    PATIENT EDUCATION:  Education details: Exercise purpose/form. Self management techniques. Education on diagnosis, prognosis, POC, anatomy and physiology of current condition. Education on HEP including handout. Person educated: Patient Education method: Explanation, Demonstration, Verbal cues, and Handouts Education comprehension: verbalized understanding, returned demonstration, and needs further education  HOME EXERCISE PROGRAM: Access Code: [email or web address] URL: [email or web address] Date: 01/26/2024 Prepared by: Vernell Rise  Exercises - Gastroc Stretch on Wall  - 1 x daily - 2 sets - 1 reps - 30 seconds hold - Soleus Stretch on Wall  - 1 x daily - 2 sets - 10 reps - 10seconds hold - Eccentric Squat  - 1 x daily - 2 sets - 5 reps - 10 seconds time to lower - Seated Anterior Tibialis Stretch  - 1-2 x daily - 5-10 reps - 10 seconds hold - Seated Figure 4 Ankle Inversion with Resistance  - 1 x daily - 2-3 sets - 20-30 reps - Standing 3-Way Kick  - 2 x daily - 3 sets - 10 reps - Standing Ankle Dorsiflexion Stretch on Chair  - 2 x daily - 2 sets - 5 reps - 5 second hold - Single Leg Heel Raise with Chair Support  - 2 x daily - 3 sets - 10 reps - Lateral Shuffles  - 2 x daily - 2 sets - 4 reps  ASSESSMENT:  CLINICAL IMPRESSION: Patient has attended 10 PT session since started his episode of care on 12/16/23. He has made progress toward most of his goals and has met his single leg heel raise goal at this time. He has been completing his HEP independently at home and has had great carryover during sessions as well. He has had a reduction in overall pain and as been dealing mainly with soreness in his anterior and lateral ankle. Patient is still lacking some AROM/PROM in his L ankle compared to his R as well. He has demonstrated increased ROM and strength but would benefit from additional strength training and balance activities to achieve his  ultimate goal of returning to basketball. Patient would benefit  from continued management of limiting condition by skilled physical therapist to address remaining impairments and functional limitations to work towards stated goals and return to PLOF or maximal functional independence.   From initial eval: Patient is a 43 y.o. male referred to outpatient physical therapy with a medical diagnosis of trimalleolar fracture of ankle, closed, left, with routine healing, who presents with signs and symptoms consistent with left ankle stiffness, pain, swelling, and weakness following ORIF on 10/02/23. Patient presents with significant pain, ROM, joint stiffness, edema, motor control, knowledge, muscle performance (Strength/power/endurance), balance, proprioception, and activity tolerance impairments that are limiting ability to complete usual activities such as working, working out, scientist, physiological, playing with son, walking, standing, running, sleeping, being up for more than 2-3 hours, wearing shoes for more than 2-3 hours without difficulty. Patient will benefit from skilled physical therapy intervention to address current body structure impairments and activity limitations to improve function and work towards goals set in current POC in order to return to prior level of function or maximal functional improvement.   OBJECTIVE IMPAIRMENTS: Abnormal gait, decreased activity tolerance, decreased balance, decreased endurance, decreased knowledge of condition, decreased mobility, difficulty walking, decreased ROM, decreased strength, hypomobility, increased edema, impaired flexibility, improper body mechanics, and pain.   ACTIVITY LIMITATIONS: carrying, standing, squatting, sleeping, stairs, transfers, bed mobility, and locomotion level  PARTICIPATION LIMITATIONS: interpersonal relationship, shopping, community activity, occupation, yard work, and   difficulty with working, playing basketball, working out, playing  with son, walking, standing, running, sleeping, being up for more than 2-3 hours, wearing shoes for more than 2-3 hours  PERSONAL FACTORS: Past/current experiences, Time since onset of injury/illness/exacerbation, and 3+ comorbidities:  as a past medical history of Acute metabolic acidosis (09/2023), Elevated BP without diagnosis of hypertension (2025), Elevated liver enzymes (09/2023), ETOH abuse, STD (male) (2024), Substance abuse (HCC), and Trimalleolar fracture of ankle, closed, left, initial encounter (09/2023). he  has a past surgical history that includes Hernia repair and ORIF ankle fracture (Left, 10/02/2023) are also affecting patient's functional outcome.   REHAB POTENTIAL: Good  CLINICAL DECISION MAKING: Stable/uncomplicated  EVALUATION COMPLEXITY: Low  GOALS: Goals reviewed with patient? No  SHORT TERM GOALS: Target date: 12/30/2023  Patient will be independent with initial home exercise program for self-management of symptoms. Baseline: Initial HEP provided at IE (12/16/23); goal met (01/27/24) Goal status: MET 01/27/24  LONG TERM GOALS: Target date: 03/09/2024  Patient will be independent with a long-term home exercise program for self-management of symptoms.  Baseline: Initial HEP provided at IE (12/16/23);participating well HEP updated as needed (01/27/24) Goal status: in progress  2.  Patient will demonstrate improved Lower Extremity Functional Scale (LEFS) to equal or greater than 64/80 to demonstrate improvement in overall condition and self-reported functional ability.  Baseline: 61/80 (12/16/23); 61/80 (01/27/24) Goal status: in progress  3.  Patient will demonstrate L ankle and great toe PROM equal to R or at least at normal without increase in pain beyond intermittant end range discomfort to improve his ability to walk, run, and work.  Baseline: limited and painful - see objective  (12/16/23); still painful an limited -see objective (01/27/24) Goal status: in  progress  4.  Patient will demonstrate the ability to complete 10 single leg heel raises on the L LE to demonstrate improved strength for walking, working, and running.  Baseline: unable to get one rep (12/16/23); able to complete 10 with B UE on counter (01/27/24) Goal status: MET 01/27/24  5.  Patient will demonstrate improvement in  Patient Specific Functional Scale (PSFS) of equal or greater than 8/10 points to reflect clinically significant improvement in patient's most valued functional activities. Baseline: to be measured at visit 2 as appropriate (12/16/23); 2.7/10 (12/23/23), 5/10 (01/27/24) Goal status: in progress  6.  Patient will report NPRS equal or less than 3/10 during functional activities during the last 2 weeks to improve their abilitly to complete community, work and/or recreational activities with less limitation. Baseline: 6/10 (12/16/23); 6/10 (01/27/24) Goal status: in progress  PLAN:  PT FREQUENCY: 2x/week  PT DURATION: 8-12 weeks  PLANNED INTERVENTIONS: 97164- PT Re-evaluation, 97750- Physical Performance Testing, 97110-Therapeutic exercises, 97530- Therapeutic activity, [id number]- Neuromuscular re-education, 97535- Self Care, [id number]- Manual therapy, [id number]- Gait training, [phone] (1-2 muscles), [id number] (3+ muscles)- Dry Needling, Patient/Family education, Balance training, Cryotherapy, and Moist heat  PLAN FOR NEXT SESSION: Continue with faster paced lateral movements and balance activities. Consider adding foam mat to single leg 3 way toe taps and ball tosses (if tolerated) compile long term HEP, regain ROM, retraing motor control and proprioception,  improve strength, and resilience needed for  performing desired functional performance with appropriate ROM, strength, power, and endurance. Manual therapy as needed.  Vernell Rise, SPT 01/27/24  Camie SAUNDERS. Juli, PT, DPT, Cert. MDT 01/27/24, 5:13 PM  [facility] [address] [place], [place] [address] P: [phone] I F: [phone]

## 2024-01-27 ENCOUNTER — Encounter: Payer: Self-pay | Admitting: Physical Therapy

## 2024-01-27 ENCOUNTER — Ambulatory Visit: Admitting: Physical Therapy

## 2024-01-27 DIAGNOSIS — M25572 Pain in left ankle and joints of left foot: Secondary | ICD-10-CM

## 2024-01-27 DIAGNOSIS — M6281 Muscle weakness (generalized): Secondary | ICD-10-CM

## 2024-01-27 DIAGNOSIS — M25675 Stiffness of left foot, not elsewhere classified: Secondary | ICD-10-CM

## 2024-01-27 DIAGNOSIS — M25672 Stiffness of left ankle, not elsewhere classified: Secondary | ICD-10-CM

## 2024-01-27 DIAGNOSIS — R262 Difficulty in walking, not elsewhere classified: Secondary | ICD-10-CM

## 2024-02-01 ENCOUNTER — Ambulatory Visit: Admitting: Physical Therapy

## 2024-02-01 DIAGNOSIS — M25572 Pain in left ankle and joints of left foot: Secondary | ICD-10-CM | POA: Diagnosis present

## 2024-02-01 DIAGNOSIS — R262 Difficulty in walking, not elsewhere classified: Secondary | ICD-10-CM | POA: Diagnosis present

## 2024-02-01 DIAGNOSIS — M25675 Stiffness of left foot, not elsewhere classified: Secondary | ICD-10-CM | POA: Insufficient documentation

## 2024-02-01 DIAGNOSIS — M25672 Stiffness of left ankle, not elsewhere classified: Secondary | ICD-10-CM | POA: Diagnosis present

## 2024-02-01 DIAGNOSIS — M6281 Muscle weakness (generalized): Secondary | ICD-10-CM | POA: Diagnosis present

## 2024-02-01 NOTE — Therapy (Signed)
 OUTPATIENT PHYSICAL THERAPY TREATMENT   Patient Name: Phillip Stephenson MRN: [id number] DOB:03-03-1981, 43 y.o., male Today's Date: 02/01/2024  END OF SESSION:  PT End of Session - 02/01/24 1023     Visit Number 11    Number of Visits 17    Date for Recertification  03/09/24    Authorization Type [place] [employer] reporting period from 12/16/23    Authorization Time Period HB auth# [id number] for 13 PT vsts from 10/20-1/20/26    Authorization - Number of Visits 13    Progress Note Due on Visit 10    PT Start Time 1037    PT Stop Time 1118    PT Time Calculation (min) 41 min    Activity Tolerance Patient tolerated treatment well    Behavior During Therapy [facility] for tasks assessed/performed            Past Medical History:  Diagnosis Date   Acute metabolic acidosis 09/2023   likely from alcoholic ketoacidosis.   Elevated BP without diagnosis of hypertension 2025   Elevated liver enzymes 09/2023   ETOH abuse    STD (male) 2024   Substance abuse (HCC)    Trimalleolar fracture of ankle, closed, left, initial encounter 09/2023   Past Surgical History:  Procedure Laterality Date   HERNIA REPAIR     ORIF ANKLE FRACTURE Left 10/02/2023   Procedure: OPEN REDUCTION INTERNAL FIXATION (ORIF) ANKLE FRACTURE;  Surgeon: Janit Thresa HERO, DPM;  Location: [facility] ORS;  Service: Orthopedics/Podiatry;  Laterality: Left;   There are no active problems to display for this patient.  PCP: no PCP per patient  REFERRING PROVIDER: Janit Thresa HERO, DPM  REFERRING DIAG: trimalleolar fracture of ankle, closed, left, with routine healing  THERAPY DIAG:  Stiffness of left ankle, not elsewhere classified  Difficulty in walking, not elsewhere classified  Pain in left ankle and joints of left foot  Stiffness of left foot, not elsewhere classified  Muscle weakness (generalized)  Rationale for Evaluation and Treatment: Rehabilitation  ONSET DATE: fracture 09/19/2023, ORIF  10/02/2023  SUBJECTIVE:   PERTINENT HISTORY: Patient is a 43 y.o. male who presents to outpatient physical therapy with a referral for medical diagnosis trimalleolar fracture of ankle, closed, left, with routine healing. This patient's chief complaints consist of left ankle pain, stiffness, swelling, and weakness s/p L ankle ORIF 8/1/205 2/2 comminuted L ankle fracture after coming down wrong while playing basketball on 09/19/23, leading to the following functional deficits:  difficulty with working, working out, playing with son, walking, standing, running, sleeping, being up for more than 2-3 hours, wearing shoes for more than 2-3 hours. Relevant past medical history and comorbidities include the following: he  has a past medical history of Acute metabolic acidosis (09/2023), Elevated BP without diagnosis of hypertension (2025), Elevated liver enzymes (09/2023), ETOH abuse, STD (male) (2024), Substance abuse (HCC), and Trimalleolar fracture of ankle, closed, left, initial encounter (09/2023). he  has a past surgical history that includes Hernia repair and ORIF ankle fracture (Left, 10/02/2023). Patient denies hx of cancer, stroke, seizures, lung problems, heart problems, diabetes, unexplained weight loss, unexplained changes in bowel or bladder problems, unexplained stumbling or dropping things, osteoporosis, and spinal surgery  Exercise history: works out (lifting and treadmill)  SUBJECTIVE STATEMENT: Patient has been feeling good and has been noticing less soreness upon waking and it increases with daily activities.    PAIN: NPRS: Current: some light soreness in L ankle along lateral malleoli  From initial PT evaluation on  12/16/23:  NPRS: Best: 2/10, Worst: 6/10. Pain location: medial and lateral Left ankle, a little over the top of the left foot Pain description: achy, sharp pains here and there. Numbness/tingling: denies Aggravating factors: doing too much for too long, going too much of a  distance Relieving factors: laying down and elevating, ibuprofen    PRECAUTIONS: None  WEIGHT BEARING RESTRICTIONS: No  FALLS:  Has patient fallen in last 6 months? Yes. Number of falls when he broke his ankle while playing ball  PLOF: no limitations due to L ankle  PATIENT GOALS: To get back walking normal without a limp  OBJECTIVE                                                                                              TREATMENT Therapeutic exercise: therapeutic exercises that incorporate ONE parameter at one or more areas of the body to centralize symptoms, develop strength and endurance, range of motion, and flexibility required for successful completion of functional activities.  Recumbent bike for improved lower extremity ROM, muscular endurance, and activity tolerance; and to induce the analgesic effect of aerobic exercise, stimulate joint nutrition, and prepare body structures and systems for following interventions.  Seat setting: 16 6  minutes Level: 4 Target RPM: 75-80 RPM:  RPE:   Seated L ankle/toes PF stretch with top of foot on floor to decrease PF ankle stiffness 2 x 5 at 10 seconds  Therapeutic activities: dynamic therapeutic activities incorporating MULTIPLE parameters or areas of the body designed to achieve improved functional performance.  Lateral shuffles between cones placed 10' apart, keeping feet at least one foot width apart, to improve functional hip and ankle eversion for ambulatory activities.  2 x 2 laps (up and back = 1 lap) Pt cued to remain on balls of feet and bend knees  Standing deficit heel raises with UE support on TM as needed to improve L LE strength and single leg stance stability for ambulation 2 x 10 up with both down with L only Added to long term HEP  Standing eccentric squat focusing on LE alignment with heels elevated on slant board for functional stretch of L ankle and to improve LE strength 2 x 5 with 5 second hold with heels  elevated on slant board  Patient noted pressure in the anterior L ankle with exercise  Soleus stretch/Ankle DF self mob on step to reduce calf tightness as well as increase DF ROM for improved step length during walking   1 x 5  with 10 second hold Pt felt stretch in posterior lateral heel as well as pressure in anterior ankle  Neuromuscular Re-education: a technique or exercise performed with the goal of improving the level of communication between the body and the brain, such as for balance, motor control, muscle activation patterns, coordination, desensitization, quality of muscle contraction, proprioception, and/or kinesthetic sense needed for successful and safe completion of functional activities.   Standing Balance board lateral ankle tilts with occasional UE assistance on TM to improve lateral motor control of ankle for improved stability 2 x 10  Standing Balance board anterior and posterior ankle  tilts with occasional UE assistance in TM improve anterior and posterior motor control of ankle for improved stability 2 x 10  Standing ball toss with varying toss placement to improve single leg stance balance and motor control with perturbations 4 x 10 with 2 kg ball single leg stance without foam pad  Standing single leg 3 way touches with clock reach to improve single leg balance with pertubation, UE support on TM for returning from clock reach 2 x 10  Added to long term HEP  Pt required multimodal cuing for proper technique and to facilitate improved neuromuscular control, strength, range of motion, and functional ability resulting in improved performance and form.  PATIENT EDUCATION:  Education details: Exercise purpose/form. Self management techniques. Education on diagnosis, prognosis, POC, anatomy and physiology of current condition. Education on HEP including handout. Person educated: Patient Education method: Explanation, Demonstration, Verbal cues, and Handouts Education  comprehension: verbalized understanding, returned demonstration, and needs further education  HOME EXERCISE PROGRAM: Access Code: [email or web address] URL: [email or web address] Date: 02/01/2024 Prepared by: Vernell Rise  Exercises - Gastroc Stretch on Wall  - 1 x daily - 2 sets - 1 reps - 30 seconds hold - Soleus Stretch on Wall  - 1 x daily - 2 sets - 10 reps - 10seconds hold - Eccentric Squat  - 1 x daily - 2 sets - 5 reps - 10 seconds time to lower - Seated Anterior Tibialis Stretch  - 1-2 x daily - 5-10 reps - 10 seconds hold - Seated Figure 4 Ankle Inversion with Resistance  - 1 x daily - 2-3 sets - 20-30 reps - Standing Ankle Dorsiflexion Stretch on Chair  - 2 x daily - 2 sets - 5 reps - 5 second hold - Lateral Shuffles  - 2 x daily - 2 sets - 4 reps - Heel Raise on Step  - 2 x daily - 3 sets - 10 reps - Single Leg Balance with Clock Reach  - 2 x daily - 3 sets - 10 reps  ASSESSMENT:  CLINICAL IMPRESSION: Patient noted a decrease in soreness in the L ankle upon completion of the balance board activities in both directions. Able to get closer to the ground with less apprehension as well. Completed deficit heel raises and 3 way toe taps with clock reach to be added to patients long term HEP. Patient was unable to complete deficit squats on L leg only so it was modified to be completed with bilateral heel raise and L leg lowering. Patient noted increased discomfort in the calf of the L ankle but it felt like a good workout. When completing the clock reach for the single leg toe taps, patient reported a pain in lateral L ankle and required UE assistance to return to standing after completion due to decreased balance. Patient was cued to reach as far as he could independently and then slowly reach further each time. When completing lateral shuffles patient was able to increase speed sightly and noted more difficulty when leading with his L leg due to stiffness and feeling uneasy about  it. Patient was cued to remain on the balls of his feet and bend his knees to help prepare his body for an increase in weight on the L leg that will be needed for increased speed of this exercise. Patient will benefit from skilled physical therapy intervention to address current body structure impairments and activity limitations to improve function and work towards goals set in current POC in order to return to  prior level of function or maximal functional improvement.   From initial PT evaluation on 12/16/23:  Patient is a 43 y.o. male referred to outpatient physical therapy with a medical diagnosis of trimalleolar fracture of ankle, closed, left, with routine healing, who presents with signs and symptoms consistent with left ankle stiffness, pain, swelling, and weakness following ORIF on 10/02/23. Patient presents with significant pain, ROM, joint stiffness, edema, motor control, knowledge, muscle performance (Strength/power/endurance), balance, proprioception, and activity tolerance impairments that are limiting ability to complete usual activities such as working, working out, scientist, physiological, playing with son, walking, standing, running, sleeping, being up for more than 2-3 hours, wearing shoes for more than 2-3 hours without difficulty. Patient will benefit from skilled physical therapy intervention to address current body structure impairments and activity limitations to improve function and work towards goals set in current POC in order to return to prior level of function or maximal functional improvement.   OBJECTIVE IMPAIRMENTS: Abnormal gait, decreased activity tolerance, decreased balance, decreased endurance, decreased knowledge of condition, decreased mobility, difficulty walking, decreased ROM, decreased strength, hypomobility, increased edema, impaired flexibility, improper body mechanics, and pain.   ACTIVITY LIMITATIONS: carrying, standing, squatting, sleeping, stairs, transfers, bed  mobility, and locomotion level  PARTICIPATION LIMITATIONS: interpersonal relationship, shopping, community activity, occupation, yard work, and difficulty with working, playing basketball, working out, playing with son, walking, standing, running, sleeping, being up for more than 2-3 hours, wearing shoes for more than 2-3 hours  PERSONAL FACTORS: Past/current experiences, Time since onset of injury/illness/exacerbation, and 3+ comorbidities: as a past medical history of Acute metabolic acidosis (09/2023), Elevated BP without diagnosis of hypertension (2025), Elevated liver enzymes (09/2023), ETOH abuse, STD (male) (2024), Substance abuse (HCC), and Trimalleolar fracture of ankle, closed, left, initial encounter (09/2023). he  has a past surgical history that includes Hernia repair and ORIF ankle fracture (Left, 10/02/2023) are also affecting patient's functional outcome.   REHAB POTENTIAL: Good  CLINICAL DECISION MAKING: Stable/uncomplicated  EVALUATION COMPLEXITY: Low  GOALS: Goals reviewed with patient? No   SHORT TERM GOALS: Target date: 12/30/2023   Patient will be independent with initial home exercise program for self-management of symptoms. Baseline: Initial HEP provided at IE (12/16/23); goal met (01/27/24) Goal status: MET 01/27/24   LONG TERM GOALS: Target date: 03/09/2024   Patient will be independent with a long-term home exercise program for self-management of symptoms.  Baseline: Initial HEP provided at IE (12/16/23);participating well HEP updated as needed (01/27/24); long term HEP given today (02/01/24) Goal status:MET 02/01/24   2.  Patient will demonstrate improved Lower Extremity Functional Scale (LEFS) to equal or greater than 64/80 to demonstrate improvement in overall condition and self-reported functional ability.  Baseline: 61/80 (12/16/23); 61/80 (01/27/24) Goal status: in progress   3.  Patient will demonstrate L ankle and great toe PROM equal to R or at least at  normal without increase in pain beyond intermittant end range discomfort to improve his ability to walk, run, and work.  Baseline: limited and painful - see objective (12/16/23); still painful an limited -see objective (01/27/24) Goal status: in progress   4.  Patient will demonstrate the ability to complete 10 single leg heel raises on the L LE to demonstrate improved strength for walking, working, and running.  Baseline: unable to get one rep (12/16/23); able to complete 10 with B UE on counter (01/27/24) Goal status: MET 01/27/24   5.  Patient will demonstrate improvement in Patient Specific Functional Scale (PSFS) of equal or greater  than 8/10 points to reflect clinically significant improvement in patient's most valued functional activities. Baseline: to be measured at visit 2 as appropriate (12/16/23); 2.7/10 (12/23/23), 5/10 (01/27/24) Goal status: in progress   6.  Patient will report NPRS equal or less than 3/10 during functional activities during the last 2 weeks to improve their abilitly to complete community, work and/or recreational activities with less limitation. Baseline: 6/10 (12/16/23); 6/10 (01/27/24) Goal status: in progress  PLAN:  PT FREQUENCY: 2x/week  PT DURATION: 8-12 weeks  PLANNED INTERVENTIONS: 97164- PT Re-evaluation, 97750- Physical Performance Testing, 97110-Therapeutic exercises, 97530- Therapeutic activity, [id number]- Neuromuscular re-education, 97535- Self Care, [id number]- Manual therapy, [id number]- Gait training, [phone] (1-2 muscles), 20561 (3+ muscles)- Dry Needling, Patient/Family education, Balance training, Cryotherapy, and Moist heat  PLAN FOR NEXT SESSION:  recheck goals/progress. Assess long term HEP compliance after being given today 02/01/24 and continue with DC from therapy.  Vernell Rise, SPT 02/01/24  Camie SAUNDERS. Juli, PT, DPT, Cert. MDT 02/01/24, 2:30 PM  [facility] [address] [place], [place] [address] P:  [phone] I F: [phone]

## 2024-02-02 ENCOUNTER — Ambulatory Visit: Admitting: Podiatry

## 2024-02-02 NOTE — Therapy (Unsigned)
 OUTPATIENT PHYSICAL THERAPY TREATMENT/DISCHARGE SUMMARY Dates Reporting From 12/16/23 - 02/03/24   Patient Name: Phillip Stephenson MRN: [id number] DOB:November 08, 1980, 43 y.o., male Today's Date: 02/03/2024  END OF SESSION:  PT End of Session - 02/03/24 1124     Visit Number 12    Number of Visits 17    Date for Recertification  03/09/24    Authorization Type [place] [employer]ICA[employer] reporting period from 12/16/23    Authorization Time Period HB auth# [id number] for 13 PT vsts from 10/20-1/20/26    Authorization - Number of Visits 13    Progress Note Due on Visit 10    PT Start Time 1119    PT Stop Time 1158    PT Time Calculation (min) 39 min    Activity Tolerance Patient tolerated treatment well    Behavior During Therapy [facility] for tasks assessed/performed          Past Medical History:  Diagnosis Date   Acute metabolic acidosis 09/2023   likely from alcoholic ketoacidosis.   Elevated BP without diagnosis of hypertension 2025   Elevated liver enzymes 09/2023   ETOH abuse    STD (male) 2024   Substance abuse (HCC)    Trimalleolar fracture of ankle, closed, left, initial encounter 09/2023   Past Surgical History:  Procedure Laterality Date   HERNIA REPAIR     ORIF ANKLE FRACTURE Left 10/02/2023   Procedure: OPEN REDUCTION INTERNAL FIXATION (ORIF) ANKLE FRACTURE;  Surgeon: Janit Thresa HERO, DPM;  Location: [facility] ORS;  Service: Orthopedics/Podiatry;  Laterality: Left;   There are no active problems to display for this patient.  PCP: no PCP per patient  REFERRING PROVIDER: Janit Thresa HERO, DPM  REFERRING DIAG: trimalleolar fracture of ankle, closed, left, with routine healing  THERAPY DIAG:  Stiffness of left ankle, not elsewhere classified  Muscle weakness (generalized)  Difficulty in walking, not elsewhere classified  Pain in left ankle and joints of left foot  Stiffness of left foot, not elsewhere classified  Rationale for Evaluation and Treatment:  Rehabilitation  ONSET DATE: fracture 09/19/2023, ORIF 10/02/2023  SUBJECTIVE:   PERTINENT HISTORY: Patient is a 43 y.o. male who presents to outpatient physical therapy with a referral for medical diagnosis trimalleolar fracture of ankle, closed, left, with routine healing. This patient's chief complaints consist of left ankle pain, stiffness, swelling, and weakness s/p L ankle ORIF 8/1/205 2/2 comminuted L ankle fracture after coming down wrong while playing basketball on 09/19/23, leading to the following functional deficits:  difficulty with working, working out, playing with son, walking, standing, running, sleeping, being up for more than 2-3 hours, wearing shoes for more than 2-3 hours. Relevant past medical history and comorbidities include the following: he  has a past medical history of Acute metabolic acidosis (09/2023), Elevated BP without diagnosis of hypertension (2025), Elevated liver enzymes (09/2023), ETOH abuse, STD (male) (2024), Substance abuse (HCC), and Trimalleolar fracture of ankle, closed, left, initial encounter (09/2023). he  has a past surgical history that includes Hernia repair and ORIF ankle fracture (Left, 10/02/2023). Patient denies hx of cancer, stroke, seizures, lung problems, heart problems, diabetes, unexplained weight loss, unexplained changes in bowel or bladder problems, unexplained stumbling or dropping things, osteoporosis, and spinal surgery  Exercise history: works out (lifting and treadmill)  SUBJECTIVE STATEMENT: Patient reported soreness in malleoli after last session that remained all day. Feels confident in his ability to complete long term HEP and progress as needed. Patient is in agreement with discharging from PT today.  PAIN: NPRS: Current: soreness in L malleoli  From initial PT evaluation on 12/16/23:  NPRS: Best: 2/10, Worst: 6/10. Pain location: medial and lateral Left ankle, a little over the top of the left foot Pain description: achy, sharp  pains here and there. Numbness/tingling: denies Aggravating factors: doing too much for too long, going too much of a distance Relieving factors: laying down and elevating, ibuprofen    PRECAUTIONS: None  WEIGHT BEARING RESTRICTIONS: No  FALLS:  Has patient fallen in last 6 months? Yes. Number of falls when he broke his ankle while playing ball  PLOF: no limitations due to L ankle  PATIENT GOALS: To get back walking normal without a limp  OBJECTIVE (last measured 01/27/24 unless otherwise noted)  SELF-REPORTED FUNCTION 02/03/24 Patient Specific Functional Scale (PSFS)  Working: 6/10 Going to the gym: 10/10 Playing with son: 6/10 Average: 7.33/10       Lower Extremity Functional Scale Extreme difficulty/unable (0), Quite a bit of difficulty (1), Moderate difficulty (2), Little difficulty (3), No difficulty (4) Survey date:  12/16/23 01/27/24  Any of your usual work, housework or school activities 3 3  2. Usual hobbies, recreational or sporting activities 0 2  3. Getting into/out of the bath 3 4  4. Walking between rooms 4 4  5. Putting on socks/shoes 4 4  6. Squatting  4 4  7. Lifting an object, like a bag of groceries from the floor 4 3  8. Performing light activities around your home 4 3  9. Performing heavy activities around your home 2 2  10. Getting into/out of a car 4 4  11. Walking 2 blocks 3 2  12. Walking 1 mile 1 2  13. Going up/down 10 stairs (1 flight) 4 4  14. Standing for 1 hour 4 4  15.  sitting for 1 hour 4 3  16. Running on even ground 2 2  17. Running on uneven ground 2 2  18. Making sharp turns while running fast 2 2  19. Hopping  3 3  20. Rolling over in bed 4 4  Score total:  61/80 61/80     PERIPHERAL JOINT MOTION (in degrees)   ACTIVE RANGE OF MOTION (AROM) *Indicates pain 12/16/23 01/27/24  Joint/Motion R/L L  Ankle/Foot      Plantarflexion 31/25 35  Everison 30/18* 22*  Inversion 30/10 11  * denotes pain   PASSIVE RANGE OF MOTION  (PROM) *Indicates pain 12/16/23 01/27/24  Joint/Motion R/L R/L  Ankle/Foot      Dorsiflexion (knee ext, CKC) 20/8* 30/10*  Dorsiflexion (knee flex, CKC) 27/10* 34/18*  Great toe extension 70/50* 85/70*  * denotes pain   MUSCLE PERFORMANCE (MMT):  *Indicates pain 12/16/23 01/27/24  Joint/Motion R/L L  Ankle/Foot      Dorsiflexion (L4) 5/4* 4+*  Great toe extension (L5) 4+/4* 4*  Eversion (S1) 4/3+* 4-*  Plantarflexion (S1) 5/4* 4+*  Inversion 5/3+* 4-*     * denotes pain                                                                                        TREATMENT Therapeutic exercise: therapeutic  exercises that incorporate ONE parameter at one or more areas of the body to centralize symptoms, develop strength and endurance, range of motion, and flexibility required for successful completion of functional activities.  Recumbent bike for improved lower extremity ROM, muscular endurance, and activity tolerance; and to induce the analgesic effect of aerobic exercise, stimulate joint nutrition, and prepare body structures and systems for following interventions.  Seat setting: 16 6  minutes Level: 4 Target RPM: >75 RPM: 75  Seated L ankle/toes PF stretch with top of foot on floor to decrease PF ankle stiffness 2 x 5 at 10 seconds  Education on HEP including handout   Therapeutic activities: dynamic therapeutic activities incorporating MULTIPLE parameters or areas of the body designed to achieve improved functional performance.  Objective measurements assessed (see above)  Lateral shuffles between cones placed 10' apart, keeping feet at least one foot width apart, to improve functional hip and ankle eversion for ambulatory activities.  2 x 2 laps (up and back = 1 lap) Pt cued to remain on balls of feet and bend knees  Standing deficit heel raises with UE support on TM as needed to improve L LE strength and single leg stance stability for ambulation 2 x 10 up with both down  with L only  Standing eccentric squat focusing on LE alignment with heels elevated on slant board for functional stretch of L ankle and to improve LE strength 2 x 5 with 5 second hold with heels elevated on slant board  Patient noted pressure in the anterior L ankle with exercise  Soleus stretch/Ankle DF self mob on step to reduce calf tightness as well as increase DF ROM for improved step length during walking   2 x 5  with 10 second hold Pt felt stretch in posterior lateral heel as well as pressure in anterior ankle  Neuromuscular Re-education: a technique or exercise performed with the goal of improving the level of communication between the body and the brain, such as for balance, motor control, muscle activation patterns, coordination, desensitization, quality of muscle contraction, proprioception, and/or kinesthetic sense needed for successful and safe completion of functional activities.   Standing Balance board lateral ankle tilts with occasional UE assistance on TM to improve lateral motor control of ankle for improved stability 2 x 10  Standing Balance board anterior and posterior ankle  tilts with occasional UE assistance in TM improve anterior and posterior motor control of ankle for improved stability 2 x 10  Standing ball toss with varying toss placement to improve single leg stance balance and motor control with perturbations 5 x 10 with 2 kg ball single leg stance without foam pad  Standing single leg 3 way touches with clock reach to improve single leg balance with pertubation   2 x 10   Pt required multimodal cuing for proper technique and to facilitate improved neuromuscular control, strength, range of motion, and functional ability resulting in improved performance and form.  39 min total: 6 min TE, 17 min TA, 16 min NR  PATIENT EDUCATION:  Education details: Exercise purpose/form. Self management techniques. Education on diagnosis, prognosis, POC, anatomy and  physiology of current condition. Education on long term HEP including handout. Provided discharge recommendations. Person educated: Patient Education method: Explanation, Demonstration, Verbal cues, and Handouts Education comprehension: verbalized understanding, returned demonstration HOME EXERCISE PROGRAM: Access Code: [email or web address] URL: [email or web address] Date: 02/01/2024 Prepared by: Vernell Rise  Exercises - Gastroc Stretch on Wall  - 1 x daily - 2 sets - 1  reps - 30 seconds hold - Soleus Stretch on Wall  - 1 x daily - 2 sets - 10 reps - 10seconds hold - Eccentric Squat  - 1 x daily - 2 sets - 5 reps - 10 seconds time to lower - Seated Anterior Tibialis Stretch  - 1-2 x daily - 5-10 reps - 10 seconds hold - Seated Figure 4 Ankle Inversion with Resistance  - 1 x daily - 2-3 sets - 20-30 reps - Standing Ankle Dorsiflexion Stretch on Chair  - 2 x daily - 2 sets - 5 reps - 5 second hold - Lateral Shuffles  - 2 x daily - 2 sets - 4 reps - Heel Raise on Step  - 2 x daily - 3 sets - 10 reps - Single Leg Balance with Clock Reach  - 2 x daily - 3 sets - 10 reps  ASSESSMENT:  CLINICAL IMPRESSION: Patient has completed 11 PT visits upon beginning episode of care on 12/16/23. He has met most of goals,made progress toward his PROM and nearly met his LEFS and PSFS goals. Patient still notices soreness in his anterior ankle upon exercise and a more persistent soreness along malleoli. Patient has been able to return to working some as well as is able to play with son more and workout which were goals the patient had set. Today's session focused on completing his long term HEP as well as checking in on progress on some of his goals. Patient noted a pain in anterior ankle at a 4/10 at completion of session, but stated he worked Sunday and then we progressed exercises on Monday and he believes that is why he feels more sore and painful today. Patient was given long term HEP upon completion of  session Monday along with progressive resistance bands. Patient appears ready to discharge to self-management with long term HEP   OBJECTIVE IMPAIRMENTS: Abnormal gait, decreased activity tolerance, decreased balance, decreased endurance, decreased knowledge of condition, decreased mobility, difficulty walking, decreased ROM, decreased strength, hypomobility, increased edema, impaired flexibility, improper body mechanics, and pain.   ACTIVITY LIMITATIONS: carrying, standing, squatting, sleeping, stairs, transfers, bed mobility, and locomotion level  PARTICIPATION LIMITATIONS: interpersonal relationship, shopping, community activity, occupation, yard work, and difficulty with working, playing basketball, working out, playing with son, walking, standing, running, sleeping, being up for more than 2-3 hours, wearing shoes for more than 2-3 hours  PERSONAL FACTORS: Past/current experiences, Time since onset of injury/illness/exacerbation, and 3+ comorbidities: as a past medical history of Acute metabolic acidosis (09/2023), Elevated BP without diagnosis of hypertension (2025), Elevated liver enzymes (09/2023), ETOH abuse, STD (male) (2024), Substance abuse (HCC), and Trimalleolar fracture of ankle, closed, left, initial encounter (09/2023). he  has a past surgical history that includes Hernia repair and ORIF ankle fracture (Left, 10/02/2023) are also affecting patient's functional outcome.   REHAB POTENTIAL: Good  CLINICAL DECISION MAKING: Stable/uncomplicated  EVALUATION COMPLEXITY: Low  GOALS: Goals reviewed with patient? No   SHORT TERM GOALS: Target date: 12/30/2023   Patient will be independent with initial home exercise program for self-management of symptoms. Baseline: Initial HEP provided at IE (12/16/23); goal met (01/27/24) Goal status: MET 01/27/24   LONG TERM GOALS: Target date: 03/09/2024   Patient will be independent with a long-term home exercise program for self-management of  symptoms.  Baseline: Initial HEP provided at IE (12/16/23);participating well HEP updated as needed (01/27/24); long term HEP given today (02/01/24) Goal status:MET 02/01/24   2.  Patient will demonstrate improved Lower Extremity  Functional Scale (LEFS) to equal or greater than 64/80 to demonstrate improvement in overall condition and self-reported functional ability.  Baseline: 61/80 (12/16/23); 61/80 (01/27/24) Goal status: Nearly Met   3.  Patient will demonstrate L ankle and great toe PROM equal to R or at least at normal without increase in pain beyond intermittant end range discomfort to improve his ability to walk, run, and work.  Baseline: limited and painful - see objective (12/16/23); still painful an limited -see objective (01/27/24) Goal status: Made progress   4.  Patient will demonstrate the ability to complete 10 single leg heel raises on the L LE to demonstrate improved strength for walking, working, and running.  Baseline: unable to get one rep (12/16/23); able to complete 10 with B UE on counter (01/27/24) Goal status: MET 01/27/24   5.  Patient will demonstrate improvement in Patient Specific Functional Scale (PSFS) of equal or greater than 8/10 points to reflect clinically significant improvement in patient's most valued functional activities. Baseline: to be measured at visit 2 as appropriate (12/16/23); 2.7/10 (12/23/23), 5/10 (01/27/24); 7.33/10 (02/03/24) Goal status: Nearly Met   6.  Patient will report NPRS equal or less than 3/10 during functional activities during the last 2 weeks to improve their abilitly to complete community, work and/or recreational activities with less limitation. Baseline: 6/10 (12/16/23); 6/10 (01/27/24); 0/10, just a soreness (02/03/24) Goal status: MET PLAN:  PT FREQUENCY: 2x/week  PT DURATION: 8-12 weeks  PLANNED INTERVENTIONS: 97164- PT Re-evaluation, 97750- Physical Performance Testing, 97110-Therapeutic exercises, 97530- Therapeutic  activity, [id number]- Neuromuscular re-education, 97535- Self Care, 02859- Manual therapy, [id number]- Gait training, [phone] (1-2 muscles), 20561 (3+ muscles)- Dry Needling, Patient/Family education, Balance training, Cryotherapy, and Moist heat  PLAN FOR NEXT SESSION:  patient has been discharged from therapy at this time with long term HEP.  Vernell Rise, SPT 02/03/24  Camie SAUNDERS. Juli, PT, DPT, Cert. MDT 02/03/24, 4:35 PM  [facility] [address] [place], [place] [address] P: [phone] I F: [phone]

## 2024-02-03 ENCOUNTER — Encounter: Payer: Self-pay | Admitting: Physical Therapy

## 2024-02-03 ENCOUNTER — Ambulatory Visit: Admitting: Physical Therapy

## 2024-02-03 DIAGNOSIS — M6281 Muscle weakness (generalized): Secondary | ICD-10-CM

## 2024-02-03 DIAGNOSIS — M25672 Stiffness of left ankle, not elsewhere classified: Secondary | ICD-10-CM

## 2024-02-03 DIAGNOSIS — M25572 Pain in left ankle and joints of left foot: Secondary | ICD-10-CM

## 2024-02-03 DIAGNOSIS — R262 Difficulty in walking, not elsewhere classified: Secondary | ICD-10-CM

## 2024-02-03 DIAGNOSIS — M25675 Stiffness of left foot, not elsewhere classified: Secondary | ICD-10-CM

## 2024-02-16 ENCOUNTER — Encounter: Payer: Self-pay | Admitting: Podiatry

## 2024-02-16 ENCOUNTER — Ambulatory Visit

## 2024-02-16 ENCOUNTER — Ambulatory Visit: Admitting: Podiatry

## 2024-02-16 VITALS — Ht 67.0 in | Wt 165.0 lb

## 2024-02-16 DIAGNOSIS — S82852D Displaced trimalleolar fracture of left lower leg, subsequent encounter for closed fracture with routine healing: Secondary | ICD-10-CM | POA: Diagnosis not present

## 2024-02-16 MED ORDER — IBUPROFEN 800 MG PO TABS: 800.0000 mg | ORAL_TABLET | Freq: Three times a day (TID) | ORAL | 1 refills | Status: AC | PRN

## 2024-02-16 MED ORDER — OXYCODONE-ACETAMINOPHEN 5-325 MG PO TABS: 1.0000 | ORAL_TABLET | Freq: Three times a day (TID) | ORAL | 0 refills | Status: DC | PRN

## 2024-02-16 NOTE — Progress Notes (Signed)
° °  Chief Complaint  Patient presents with   Routine Post Op    POV #5  DOS 10/02/23 LEFT OPEN REDUCTION W INTERNAL FIXATION ANKLE FRACTURE    Subjective:  Patient presents today status post ORIF comminuted left ankle fracture.  DOS: 10/02/2023.  [facility].  Continues to do well.  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   Elevated BP without diagnosis of hypertension 2025   Elevated liver enzymes 09/2023   ETOH abuse    STD (male) 2024   Substance abuse (HCC)    Trimalleolar fracture of ankle, closed, left, initial encounter 09/2023    Past Surgical History:  Procedure Laterality Date   HERNIA REPAIR     ORIF ANKLE FRACTURE Left 10/02/2023   Procedure: OPEN REDUCTION INTERNAL FIXATION (ORIF) ANKLE FRACTURE;  Surgeon: Janit Thresa HERO, DPM;  Location: [facility] ORS;  Service: Orthopedics/Podiatry;  Laterality: Left;    No Known Allergies  Objective/Physical Exam Unchanged.  Continued improvement noted to the ankle.  Neurovascular status intact.  Incisions healed.  Moderate chronic edema noted to the ankle.  Radiographic Exam LT ankle 12/08/2023:  Stable with routine healing.  Orthopedic hardware is intact.  Stable.   tibiotalar joint congruent  Assessment: 1. s/p ORIF LT ankle. DOS: 10/02/2023   Plan of Care:  -Patient was evaluated. - Continue Motrin  800 mg 3 times daily as needed -Okay to transition out of cam boot to good supportive tennis shoes and sneakers - Order placed for physical therapy at [facility]  -Return to clinic 8 weeks  Thresa EMERSON Janit, DPM [facility]  Dr. Thresa EMERSON Janit, DPM    [address] [place], [place] [address]                Office [phone]  Fax [phone]

## 2024-03-15 ENCOUNTER — Ambulatory Visit: Admitting: Podiatry

## 2024-03-15 ENCOUNTER — Telehealth: Payer: Self-pay | Admitting: Podiatry

## 2024-03-15 NOTE — Telephone Encounter (Signed)
 Requesting a refill: oxyCODONE -acetaminophen  (PERCOCET) 5-325 MG tablet  Sent to:  [facility] [address] [place]), [place] - [address] Phone: [phone]  Fax: [phone]    Having car trouble and need to cancel appt for today.

## 2024-03-17 NOTE — Telephone Encounter (Signed)
 Last visit 02/16/2024, last fill on medication being requested 02/16/2024.

## 2024-03-17 NOTE — Telephone Encounter (Signed)
 Thank you for the update!

## 2024-03-17 NOTE — Telephone Encounter (Signed)
 Returned call to patient. Reviewed plan of care from visit. Patient reports having completed PT and having already transitioned to regular shoe gear. He is requesting refill on Percocet due to residual pain. While reviewing plan of care, patient informed RN that he has a visit on Tuesday (1/27) with the doctor and that they can discuss it at that time.

## 2024-03-22 ENCOUNTER — Telehealth: Payer: Self-pay | Admitting: Podiatry

## 2024-03-22 NOTE — Telephone Encounter (Signed)
 Patient came in to [facility] office today wanting to talk to Dr Janit about his medication. Patient states he needs a refill of ibruprophen and his pain medication sent to [facility]

## 2024-03-29 ENCOUNTER — Ambulatory Visit: Admitting: Podiatry

## 2024-03-29 ENCOUNTER — Other Ambulatory Visit: Payer: Self-pay | Admitting: Podiatry

## 2024-03-29 MED ORDER — OXYCODONE-ACETAMINOPHEN 5-325 MG PO TABS: 1.0000 | ORAL_TABLET | Freq: Three times a day (TID) | ORAL | 0 refills | Status: AC | PRN

## 2024-04-19 ENCOUNTER — Ambulatory Visit: Admitting: Podiatry
